# Patient Record
Sex: Female | Born: 1995 | Race: Black or African American | Hispanic: No | Marital: Single | Smoking: Never smoker
Health system: Southern US, Community
[De-identification: ages and names within clinical notes are randomized; demographics above are authoritative.]

## PROBLEM LIST (undated history)

## (undated) DIAGNOSIS — F319 Bipolar disorder, unspecified: Secondary | ICD-10-CM

---
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
## 2013-10-22 ENCOUNTER — Encounter (HOSPITAL_COMMUNITY): Payer: Self-pay | Admitting: Emergency Medicine

## 2013-10-22 ENCOUNTER — Emergency Department (HOSPITAL_COMMUNITY)
Admission: EM | Admit: 2013-10-22 | Discharge: 2013-10-22 | Disposition: A | Discharge status: Home / Self Care | Payer: Commercial Indemnity | Attending: Emergency Medicine | Admitting: Emergency Medicine

## 2013-10-22 DIAGNOSIS — Z792 Long term (current) use of antibiotics: Secondary | ICD-10-CM | POA: Insufficient documentation

## 2013-10-22 DIAGNOSIS — L03317 Cellulitis of buttock: Secondary | ICD-10-CM | POA: Diagnosis not present

## 2013-10-22 DIAGNOSIS — R509 Fever, unspecified: Secondary | ICD-10-CM | POA: Insufficient documentation

## 2013-10-22 DIAGNOSIS — L0231 Cutaneous abscess of buttock: Secondary | ICD-10-CM | POA: Diagnosis present

## 2013-10-22 MED ORDER — TRAMADOL HCL 50 MG PO TABS: 50.0000 mg | ORAL_TABLET | Freq: Four times a day (QID) | ORAL | Status: DC | PRN

## 2013-10-22 MED ORDER — SULFAMETHOXAZOLE-TMP DS 800-160 MG PO TABS: 1.0000 | ORAL_TABLET | Freq: Two times a day (BID) | ORAL | Status: DC

## 2013-10-22 MED ORDER — MORPHINE SULFATE 4 MG/ML IJ SOLN
4.0000 mg | Freq: Once | INTRAMUSCULAR | Status: AC
  Administered 2013-10-22: 4 mg via INTRAMUSCULAR
  Filled 2013-10-22: qty 1

## 2013-10-22 NOTE — ED Notes (Signed)
Pt reports having painful abscess to her buttock x 3 days. Denies fever. No acute distress noted at triage.

## 2013-10-22 NOTE — ED Provider Notes (Signed)
CSN: [id number]     Arrival date & time 10/22/13  1139 History   First MD Initiated Contact with Patient 10/22/13 1303     Chief Complaint  Patient presents with  . Abscess     (Consider location/radiation/quality/duration/timing/severity/associated sxs/prior Treatment) HPI Comments: Patient is a 18 yo F presenting to the emergency department for an abscess to her right buttock that began 3 days ago. She states it was initially small but has increased in size. It is severely painful. She states it is painful to sit on her bottom or to palpate the area. She has tried taking old antibiotics from previous abscess on with an old oxycodone with little to no improvement of symptoms. Patient states he's had an abscess to the same area in the past that was successfully I&D'd in the emergency department. Does endorse one episode of fever 102F (PO) last evening none since.   Patient is a 18 y.o. female presenting with abscess.  Abscess Associated symptoms: fever     History reviewed. No pertinent past medical history. History reviewed. No pertinent past surgical history. Family History  Problem Relation Age of Onset  . Diabetes Mother    History  Substance Use Topics  . Smoking status: Never Smoker   . Smokeless tobacco: Not on file  . Alcohol Use: No   OB History   Grav Para Term Preterm Abortions TAB SAB Ect Mult Living                 Review of Systems  Constitutional: Positive for fever.  Skin: Positive for wound.  All other systems reviewed and are negative.     Allergies  Review of patient's allergies indicates no known allergies.  Home Medications   Prior to Admission medications   Medication Sig Start Date End Date Taking? Authorizing Provider  sulfamethoxazole-trimethoprim (BACTRIM DS) 800-160 MG per tablet Take 1 tablet by mouth 2 (two) times daily. 10/22/13   Keats Kingry L Vanice Rappa, PA-C  traMADol (ULTRAM) 50 MG tablet Take 1 tablet (50 mg total) by mouth every 6  (six) hours as needed. 10/22/13   Tareka Jhaveri L Antoneo Ghrist, PA-C   BP 118/71  Pulse 101  Temp(Src) 98.2 F (36.8 C) (Oral)  Resp 16  Ht 5\' 8"  (1.727 m)  Wt 192 lb 9 oz (87.346 kg)  BMI 29.29 kg/m2  SpO2 100% Physical Exam  Nursing note and vitals reviewed. Constitutional: She is oriented to person, place, and time. She appears well-developed and well-nourished. No distress.  HENT:  Head: Normocephalic and atraumatic.  Right Ear: External ear normal.  Left Ear: External ear normal.  Nose: Nose normal.  Mouth/Throat: Oropharynx is clear and moist.  Eyes: Conjunctivae are normal.  Neck: Normal range of motion. Neck supple.  Cardiovascular: Normal rate, regular rhythm and normal heart sounds.   Pulmonary/Chest: Effort normal and breath sounds normal. No respiratory distress.  Abdominal: Soft. There is no tenderness.  Musculoskeletal: Normal range of motion.  Neurological: She is alert and oriented to person, place, and time.  Skin: Skin is warm and dry. She is not diaphoretic.     Psychiatric: She has a normal mood and affect.    ED Course  Procedures (including critical care time) Medications  morphine 4 MG/ML injection 4 mg (4 mg Intramuscular Given 10/22/13 1341)    Labs Review Labs Reviewed - No data to display  Imaging Review No results found.   EKG Interpretation None      INCISION AND DRAINAGE Performed by: Silvestre Moment,  Kiran Lapine L Consent: Verbal consent obtained. Risks and benefits: risks, benefits and alternatives were discussed Type: abscess  Body area: right buttock  Anesthesia: local infiltration  Incision was made with a scalpel.  Local anesthetic: lidocaine 2% w/ epinephrine  Anesthetic total: 7 ml  Complexity: complex Blunt dissection to break up loculations  Drainage: purulent  Drainage amount: minimal  Packing material: none  Patient tolerance: Patient tolerated the procedure well with no immediate complications.    MDM   Final  diagnoses:  Abscess of buttock, right    Filed Vitals:   10/22/13 1400  BP: 118/71  Pulse: 101  Temp:   Resp: 16   Afebrile, NAD, non-toxic appearing, AAOx4. Tachycardia improved with pain management.  Patient with skin abscess amenable to incision and drainage.  Abscess was not large enough to warrant packing or drain,  wound recheck in 2 days. Encouraged home warm soaks and flushing.  Mild signs of cellulitis is surrounding skin.  Will d/c to home with pain medications and antibiotics. Advised PCP recheck in 2 days. Return precautions discussed. Patient is agreeable to plan. Patient is stable at time of discharge       Jeannetta EllisJennifer L Hedda Crumbley, PA-C 10/22/13 1534

## 2013-10-22 NOTE — Discharge Instructions (Signed)
Please follow up with your primary care physician in 1-2 days. If you do not have one please call the [facility] number listed above. Please take your antibiotic until completion. Please take pain medication and/or muscle relaxants as prescribed and as needed for pain. Please do not drive on narcotic pain medication or on muscle relaxants. Please use warm compresses to help the area. Please read all discharge instructions and return precautions.    Abscess Care After An abscess (also called a boil or furuncle) is an infected area that contains a collection of pus. Signs and symptoms of an abscess include pain, tenderness, redness, or hardness, or you may feel a moveable soft area under your skin. An abscess can occur anywhere in the body. The infection may spread to surrounding tissues causing cellulitis. A cut (incision) by the surgeon was made over your abscess and the pus was drained out. Gauze may have been packed into the space to provide a drain that will allow the cavity to heal from the inside outwards. The boil may be painful for 5 to 7 days. Most people with a boil do not have high fevers. Your abscess, if seen early, may not have localized, and may not have been lanced. If not, another appointment may be required for this if it does not get better on its own or with medications. HOME CARE INSTRUCTIONS   Only take over-the-counter or prescription medicines for pain, discomfort, or fever as directed by your caregiver.  When you bathe, soak and then remove gauze or iodoform packs at least daily or as directed by your caregiver. You may then wash the wound gently with mild soapy water. Repack with gauze or do as your caregiver directs. SEEK IMMEDIATE MEDICAL CARE IF:   You develop increased pain, swelling, redness, drainage, or bleeding in the wound site.  You develop signs of generalized infection including muscle aches, chills, fever, or a general ill feeling.  An oral  temperature above 102 F (38.9 C) develops, not controlled by medication. See your caregiver for a recheck if you develop any of the symptoms described above. If medications (antibiotics) were prescribed, take them as directed. Document Released: 09/11/2004 Document Revised: 05/18/2011 Document Reviewed: 05/09/2007 [facility] [employer] Patient Information 2015 [employer] [place]. This information is not intended to replace advice given to you by your health care provider. Make sure you discuss any questions you have with your health care provider.

## 2013-10-22 NOTE — ED Notes (Signed)
PA at bedside treating abcess

## 2013-10-25 NOTE — ED Provider Notes (Signed)
Medical screening examination/treatment/procedure(s) were performed by non-physician practitioner and as supervising physician I was immediately available for consultation/collaboration.  Bob Eastwood T Obaloluwa Delatte, MD 10/25/13 1001 

## 2013-10-26 ENCOUNTER — Ambulatory Visit (INDEPENDENT_AMBULATORY_CARE_PROVIDER_SITE_OTHER): Payer: Managed Care, Other (non HMO) | Admitting: Family Medicine

## 2013-10-26 ENCOUNTER — Encounter: Payer: Self-pay | Admitting: Family Medicine

## 2013-10-26 VITALS — BP 110/72 | HR 86 | Temp 98.8°F | Resp 14 | Wt 189.0 lb

## 2013-10-26 DIAGNOSIS — L03317 Cellulitis of buttock: Principal | ICD-10-CM

## 2013-10-26 DIAGNOSIS — L0231 Cutaneous abscess of buttock: Secondary | ICD-10-CM

## 2013-10-26 MED ORDER — CLINDAMYCIN HCL 300 MG PO CAPS: 300.0000 mg | ORAL_CAPSULE | Freq: Three times a day (TID) | ORAL | Status: DC

## 2013-10-26 NOTE — Progress Notes (Signed)
Subjective:    Patient ID: Leslie Ballard, female    DOB: Dec 09, 1995, 18 y.o.   MRN: [id number]  HPI  Patient was seen in the emergency room 4 days ago for absccess on her left buttock. In the emergency room she underwent incision and drainage. Very little purulent material was expressed. There is no wound culture for followup. The patient was started on Bactrim to cover possible MRSA. She is here today for followup. Unfortunately she states that the pain is just as bad as it was 4 days ago. States the pain may actually be worse. Examination of the area shows a vertical incision approximately 3 cm long on the left intergluteal cleft. There is slight erythema. The area above the incision is tense and firm and not fluctuant. It is extremely tender when I press in this area. No past medical history on file. Current Outpatient Prescriptions on File Prior to Visit  Medication Sig Dispense Refill  . sulfamethoxazole-trimethoprim (BACTRIM DS) 800-160 MG per tablet Take 1 tablet by mouth 2 (two) times daily.  14 tablet  0  . traMADol (ULTRAM) 50 MG tablet Take 1 tablet (50 mg total) by mouth every 6 (six) hours as needed.  15 tablet  0   No current facility-administered medications on file prior to visit.   No Known Allergies History   Social History  . Marital Status: Single    Spouse Name: N/A    Number of Children: N/A  . Years of Education: N/A   Occupational History  . Not on file.   Social History Main Topics  . Smoking status: Never Smoker   . Smokeless tobacco: Not on file  . Alcohol Use: No  . Drug Use: No  . Sexual Activity: Yes    Birth Control/ Protection: Injection   Other Topics Concern  . Not on file   Social History Narrative  . No narrative on file     Review of Systems  All other systems reviewed and are negative.      Objective:   Physical Exam  Vitals reviewed. Cardiovascular: Normal rate and regular rhythm.   Pulmonary/Chest: Effort normal and  breath sounds normal.  Skin: Skin is warm. There is erythema.   Examination of the area shows a vertical incision approximately 3 cm long on the left intergluteal cleft. There is slight erythema. The area above the incision is tense and firm and not fluctuant. It is extremely tender when I press in this area. The tender firm area is approximately 5 cm in diameter.       Assessment & Plan:  Cellulitis and abscess of buttock  Patient seems to be worsening. I anesthetized the tender area with 0.1% lidocaine. I extended the patient's incision. I probed the wound with hemostats to break up any loculations. Copious purulent material was expressed. There was at least 50 mL of pus and blood expressed from the wound cavity.  The wound cavity was approximately 2 inches deep.   A wound culture was sent of the wound cavity. I then packed the wound with over 12 inches of quarter inch iodoform gauze. Recheck the patient in 24 hours.  She is currently covered for gram negatives as well as MRSA with Bactrim.  I will add clindamycin to cover anaerobes and extend MRSA coverage 300 mg potid for 7 days.  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  If this is worsening she will need general surgical evaluation for possible deep incision and  drainage.

## 2013-10-27 ENCOUNTER — Ambulatory Visit (INDEPENDENT_AMBULATORY_CARE_PROVIDER_SITE_OTHER): Payer: Managed Care, Other (non HMO) | Admitting: Family Medicine

## 2013-10-27 ENCOUNTER — Ambulatory Visit: Payer: Managed Care, Other (non HMO) | Admitting: Family Medicine

## 2013-10-27 ENCOUNTER — Encounter: Payer: Self-pay | Admitting: Family Medicine

## 2013-10-27 VITALS — BP 146/94 | HR 78 | Temp 98.5°F | Resp 16

## 2013-10-27 DIAGNOSIS — L0231 Cutaneous abscess of buttock: Secondary | ICD-10-CM

## 2013-10-27 DIAGNOSIS — L03317 Cellulitis of buttock: Principal | ICD-10-CM

## 2013-10-27 NOTE — Progress Notes (Signed)
Subjective:    Patient ID: Leslie Ballard, female    DOB: [birth year], 18 y.o.   MRN: [id number]  HPI  10/26/13 Patient was seen in the emergency room 4 days ago for absccess on her left buttock. In the emergency room she underwent incision and drainage. Very little purulent material was expressed. There is no wound culture for followup. The patient was started on Bactrim to cover possible MRSA. She is here today for followup. Unfortunately she states that the pain is just as bad as it was 4 days ago. States the pain may actually be worse. Examination of the area shows a vertical incision approximately 3 cm long on the left intergluteal cleft. There is slight erythema. The area above the incision is tense and firm and not fluctuant. It is extremely tender when I press in this area.  At that time, my plan was:  Patient seems to be worsening. I anesthetized the tender area with 0.1% lidocaine. I extended the patient's incision. I probed the wound with hemostats to break up any loculations. Copious purulent material was expressed. There was at least 50 mL of pus and blood expressed from the wound cavity.  The wound cavity was approximately 2 inches deep.   A wound culture was sent of the wound cavity. I then packed the wound with over 12 inches of quarter inch iodoform gauze. Recheck the patient in 24 hours.  She is currently covered for gram negatives as well as MRSA with Bactrim.  I will add clindamycin to cover anaerobes and extend MRSA coverage 300 mg potid for 7 days.  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  If this is worsening she will need general surgical evaluation for possible deep incision and drainage.  10/27/13 Patient is here today for recheck. No past medical history on file. Current Outpatient Prescriptions on File Prior to Visit  Medication Sig Dispense Refill  . clindamycin (CLEOCIN) 300 MG capsule Take 1 capsule (300 mg total) by mouth 3 (three) times daily.  21  capsule  0  . sulfamethoxazole-trimethoprim (BACTRIM DS) 800-160 MG per tablet Take 1 tablet by mouth 2 (two) times daily.  14 tablet  0  . traMADol (ULTRAM) 50 MG tablet Take 1 tablet (50 mg total) by mouth every 6 (six) hours as needed.  15 tablet  0   No current facility-administered medications on file prior to visit.   No Known Allergies History   Social History  . Marital Status: Single    Spouse Name: N/A    Number of Children: N/A  . Years of Education: N/A   Occupational History  . Not on file.   Social History Main Topics  . Smoking status: Never Smoker   . Smokeless tobacco: Not on file  . Alcohol Use: No  . Drug Use: No  . Sexual Activity: Yes    Birth Control/ Protection: Injection   Other Topics Concern  . Not on file   Social History Narrative  . No narrative on file     Review of Systems  All other systems reviewed and are negative.      Objective:   Physical Exam  Vitals reviewed. Cardiovascular: Normal rate and regular rhythm.   Pulmonary/Chest: Effort normal and breath sounds normal.  Skin: Skin is warm. There is erythema.   there is less erythema today. There is no longer indurated or fluctuant. It is somewhat tender to palpation but it is dramatically improved from yesterday.  Assessment & Plan:  Cellulitis and abscess of buttock  Abscess definitely seems to be improved from yesterday. I removed all 12 inches of packing I placed him yesterday. I replaced it with fresh iodoform gauze. The wound is packed with 12 inches of 1/4 inch iodoform gauze.  Wound care was discussed. The patient's family was instructed to remove approximately 1 inch gauze every day until all the packing is removed. She is instructed to continue her antibiotics until completed. Followup in one-week if no better or immediately if worse. I anticipate gradual resolution of the wound over the next 2-3 weeks.

## 2013-10-29 LAB — WOUND CULTURE: Gram Stain: NONE SEEN

## 2013-11-03 ENCOUNTER — Ambulatory Visit (INDEPENDENT_AMBULATORY_CARE_PROVIDER_SITE_OTHER): Payer: Managed Care, Other (non HMO) | Admitting: Family Medicine

## 2013-11-03 ENCOUNTER — Encounter: Payer: Self-pay | Admitting: Family Medicine

## 2013-11-03 VITALS — BP 128/72 | HR 74 | Temp 98.5°F | Resp 18 | Wt 192.0 lb

## 2013-11-03 DIAGNOSIS — L03317 Cellulitis of buttock: Principal | ICD-10-CM

## 2013-11-03 DIAGNOSIS — L0231 Cutaneous abscess of buttock: Secondary | ICD-10-CM

## 2013-11-03 NOTE — Progress Notes (Signed)
Subjective:    Patient ID: Leslie Ballard, female    DOB: 13-Oct-1995, 18 y.o.   MRN: [id number]  HPI  10/26/13 Patient was seen in the emergency room 4 days ago for absccess on her left buttock. In the emergency room she underwent incision and drainage. Very little purulent material was expressed. There is no wound culture for followup. The patient was started on Bactrim to cover possible MRSA. She is here today for followup. Unfortunately she states that the pain is just as bad as it was 4 days ago. States the pain may actually be worse. Examination of the area shows a vertical incision approximately 3 cm long on the left intergluteal cleft. There is slight erythema. The area above the incision is tense and firm and not fluctuant. It is extremely tender when I press in this area.  At that time, my plan was:  Patient seems to be worsening. I anesthetized the tender area with 0.1% lidocaine. I extended the patient's incision. I probed the wound with hemostats to break up any loculations. Copious purulent material was expressed. There was at least 50 mL of pus and blood expressed from the wound cavity.  The wound cavity was approximately 2 inches deep.   A wound culture was sent of the wound cavity. I then packed the wound with over 12 inches of quarter inch iodoform gauze. Recheck the patient in 24 hours.  She is currently covered for gram negatives as well as MRSA with Bactrim.  I will add clindamycin to cover anaerobes and extend MRSA coverage 300 mg potid for 7 days.  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  If this is worsening she will need general surgical evaluation for possible deep incision and drainage.  10/27/13 Patient is here today for recheck.  At that time, my plan was: Cellulitis and abscess of buttock  Abscess definitely seems to be improved from yesterday. I removed all 12 inches of packing I placed him yesterday. I replaced it with fresh iodoform gauze. The wound  is packed with 12 inches of 1/4 inch iodoform gauze.  Wound care was discussed. The patient's family was instructed to remove approximately 1 inch gauze every day until all the packing is removed. She is instructed to continue her antibiotics until completed. Followup in one-week if no better or immediately if worse. I anticipate gradual resolution of the wound over the next 2-3 weeks.  11/03/13 Patient is here today for recheck. The wound looks much better. There is still a 3 cm very shallow incision. However the wound cavity has completely closed. All the gauze has come out.  There is no induration. There is no erythema. There is no pain or warmth to palpation. No past medical history on file. Current Outpatient Prescriptions on File Prior to Visit  Medication Sig Dispense Refill  . clindamycin (CLEOCIN) 300 MG capsule Take 1 capsule (300 mg total) by mouth 3 (three) times daily.  21 capsule  0  . sulfamethoxazole-trimethoprim (BACTRIM DS) 800-160 MG per tablet Take 1 tablet by mouth 2 (two) times daily.  14 tablet  0  . traMADol (ULTRAM) 50 MG tablet Take 1 tablet (50 mg total) by mouth every 6 (six) hours as needed.  15 tablet  0   No current facility-administered medications on file prior to visit.   No Known Allergies History   Social History  . Marital Status: Single    Spouse Name: N/A    Number of Children: N/A  .  Years of Education: N/A   Occupational History  . Not on file.   Social History Main Topics  . Smoking status: Never Smoker   . Smokeless tobacco: Not on file  . Alcohol Use: No  . Drug Use: No  . Sexual Activity: Yes    Birth Control/ Protection: Injection   Other Topics Concern  . Not on file   Social History Narrative  . No narrative on file     Review of Systems  All other systems reviewed and are negative.      Objective:   Physical Exam  Vitals reviewed. Cardiovascular: Normal rate and regular rhythm.   Pulmonary/Chest: Effort normal and  breath sounds normal.  Skin: Skin is warm. No erythema.        Assessment & Plan:  Cellulitis and abscess of buttock   Situation has almost completely resolved. I recommend patient keep the residual wound covered with Polysporin every time she goes to the restroom. I recommend she keep clean and dry. I anticipate gradual closure of the wound by secondary intention over the next 7-10 days. Recheck in 2 weeks if persistent or immediately if worse. Otherwise no followup is necessary.

## 2013-11-21 ENCOUNTER — Ambulatory Visit: Payer: Managed Care, Other (non HMO)

## 2014-01-17 ENCOUNTER — Encounter: Payer: Self-pay | Admitting: Family Medicine

## 2014-01-17 ENCOUNTER — Ambulatory Visit (INDEPENDENT_AMBULATORY_CARE_PROVIDER_SITE_OTHER): Payer: Medicaid Other | Admitting: Family Medicine

## 2014-01-17 VITALS — BP 128/72 | HR 62 | Temp 98.6°F | Resp 16 | Ht 65.0 in | Wt 194.0 lb

## 2014-01-17 DIAGNOSIS — L0231 Cutaneous abscess of buttock: Secondary | ICD-10-CM

## 2014-01-17 MED ORDER — SULFAMETHOXAZOLE-TRIMETHOPRIM 800-160 MG PO TABS: 1.0000 | ORAL_TABLET | Freq: Two times a day (BID) | ORAL | Status: DC

## 2014-01-17 MED ORDER — HYDROCODONE-ACETAMINOPHEN 5-325 MG PO TABS: 1.0000 | ORAL_TABLET | Freq: Four times a day (QID) | ORAL | Status: DC | PRN

## 2014-01-17 NOTE — Progress Notes (Signed)
Patient ID: Leslie DeistJessica Ann Vallandingham, female   DOB: 08/24/1995, 18 y.o.   MRN: [id number]   Subjective:    Patient ID: Leslie DeistJessica Ann Bonelli, female    DOB: 08/24/1995, 18 y.o.   MRN: [id number]  Patient presents for Cyst patient here with recurrent abscess to right gluteus, she was seen in the emergency room back in August she had a small I and D done at that time however her abscess did not improve and worsened she didn't presented to our office as her primary care provider and a second incision and drainage was done which did produce a large amount of pus. The abscess did heal up back in the same exact spot as the previous. She states that he came up over the past 3 days out of nowhere. She is in significant pain, denies fever    Review Of Systems:  GEN- denies fatigue, fever, weight loss,weakness, recent illness HEENT- denies eye drainage, change in vision, nasal discharge, CVS- denies chest pain, palpitations RESP- denies SOB, cough, wheeze ABD- denies N/V, change in stools, abd pain GU- denies dysuria, hematuria, dribbling, incontinence MSK- denies joint pain, muscle aches, injury Neuro- denies headache, dizziness, syncope, seizure activity       Objective:    BP 128/72 mmHg  Pulse 62  Temp(Src) 98.6 F (37 C) (Oral)  Resp 16  Ht 5\' 5"  (1.651 m)  Wt 194 lb (87.998 kg)  BMI 32.28 kg/m2 GEN- Uncomfortable appearing, cyring, NAD , alert and oriented x 3 Skin- RIght gluteal cleft extending to gluteus - large 3 x 4cm fluctant region with erythem very TTP,   Procedure- Incision and Drainage Procedure explained to patient questions answered benefits and risks discussed verbal consent obtained. Antiseptic-Betadine Anesthesia-lidocaine w epi Incision performed large amount of pus expressed ( Approx 3/4 cup Culture taken 1 foot of iodorm packed into large cavity Patient tolerated procedure well Bandage applied       Assessment & Plan:      Problem List Items Addressed This Visit     None    Visit Diagnoses    Abscess, gluteal, right    -  Primary    recurrent gluteal abscess, needs surgical evaluation, large amount of pus removed, deep cavity, start bactrim, norco    Relevant Orders       Ambulatory referral to General Surgery       Wound culture       Note: This dictation was prepared with Dragon dictation along with smaller phrase technology. Any transcriptional errors that result from this process are unintentional.

## 2014-01-17 NOTE — Patient Instructions (Signed)
Go to surgeons appointment as scheduled Take antibiotics by mouth twice a day  Take pain medication Sitz bath

## 2014-01-19 ENCOUNTER — Ambulatory Visit: Payer: Managed Care, Other (non HMO) | Admitting: Family Medicine

## 2014-01-20 LAB — WOUND CULTURE: GRAM STAIN: NONE SEEN

## 2015-05-07 ENCOUNTER — Encounter: Payer: Self-pay | Admitting: Family Medicine

## 2015-05-17 LAB — POCT URINE PREGNANCY: Preg Test, Ur: NEGATIVE

## 2015-08-27 ENCOUNTER — Encounter: Payer: Self-pay | Admitting: Family Medicine

## 2016-04-07 LAB — POCT URINE PREGNANCY: Preg Test, Ur: NEGATIVE

## 2018-10-17 ENCOUNTER — Other Ambulatory Visit: Payer: Self-pay

## 2018-10-17 DIAGNOSIS — R6889 Other general symptoms and signs: Secondary | ICD-10-CM | POA: Diagnosis not present

## 2018-10-17 DIAGNOSIS — Z20822 Contact with and (suspected) exposure to covid-19: Secondary | ICD-10-CM

## 2018-10-19 LAB — NOVEL CORONAVIRUS, NAA: SARS-CoV-2, NAA: NOT DETECTED

## 2018-10-26 ENCOUNTER — Encounter (HOSPITAL_COMMUNITY): Payer: Self-pay

## 2018-10-26 ENCOUNTER — Emergency Department (HOSPITAL_COMMUNITY)
Admission: EM | Admit: 2018-10-26 | Discharge: 2018-10-26 | Disposition: A | Discharge status: Home / Self Care | Payer: Commercial Indemnity | Attending: Emergency Medicine | Admitting: Emergency Medicine

## 2018-10-26 ENCOUNTER — Other Ambulatory Visit: Payer: Self-pay

## 2018-10-26 ENCOUNTER — Emergency Department (HOSPITAL_COMMUNITY): Payer: Commercial Indemnity

## 2018-10-26 DIAGNOSIS — Y999 Unspecified external cause status: Secondary | ICD-10-CM | POA: Insufficient documentation

## 2018-10-26 DIAGNOSIS — Y93I9 Activity, other involving external motion: Secondary | ICD-10-CM | POA: Insufficient documentation

## 2018-10-26 DIAGNOSIS — Z79899 Other long term (current) drug therapy: Secondary | ICD-10-CM | POA: Diagnosis not present

## 2018-10-26 DIAGNOSIS — M25519 Pain in unspecified shoulder: Secondary | ICD-10-CM | POA: Diagnosis not present

## 2018-10-26 DIAGNOSIS — M25512 Pain in left shoulder: Secondary | ICD-10-CM | POA: Diagnosis not present

## 2018-10-26 DIAGNOSIS — M545 Low back pain: Secondary | ICD-10-CM | POA: Diagnosis not present

## 2018-10-26 DIAGNOSIS — Y92410 Unspecified street and highway as the place of occurrence of the external cause: Secondary | ICD-10-CM | POA: Diagnosis not present

## 2018-10-26 DIAGNOSIS — R52 Pain, unspecified: Secondary | ICD-10-CM | POA: Diagnosis not present

## 2018-10-26 HISTORY — DX: Bipolar disorder, unspecified: F31.9

## 2018-10-26 MED ORDER — METHOCARBAMOL 500 MG PO TABS: 500.0000 mg | ORAL_TABLET | Freq: Three times a day (TID) | ORAL | 0 refills | Status: DC | PRN

## 2018-10-26 MED ORDER — NAPROXEN 500 MG PO TABS: 500.0000 mg | ORAL_TABLET | Freq: Two times a day (BID) | ORAL | 0 refills | Status: DC

## 2018-10-26 MED ORDER — ACETAMINOPHEN 325 MG PO TABS
650.0000 mg | ORAL_TABLET | Freq: Once | ORAL | Status: AC
  Administered 2018-10-26: 19:00:00 650 mg via ORAL
  Filled 2018-10-26: qty 2
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
## 2018-12-06 DIAGNOSIS — F919 Conduct disorder, unspecified: Secondary | ICD-10-CM | POA: Diagnosis not present

## 2018-12-06 DIAGNOSIS — F331 Major depressive disorder, recurrent, moderate: Secondary | ICD-10-CM | POA: Diagnosis not present

## 2018-12-06 DIAGNOSIS — F79 Unspecified intellectual disabilities: Secondary | ICD-10-CM | POA: Diagnosis not present

## 2018-12-06 DIAGNOSIS — F419 Anxiety disorder, unspecified: Secondary | ICD-10-CM | POA: Diagnosis not present

## 2018-12-06 DIAGNOSIS — F632 Kleptomania: Secondary | ICD-10-CM | POA: Diagnosis not present

## 2018-12-07 ENCOUNTER — Ambulatory Visit (INDEPENDENT_AMBULATORY_CARE_PROVIDER_SITE_OTHER): Payer: Medicaid Other

## 2018-12-07 ENCOUNTER — Other Ambulatory Visit: Payer: Self-pay

## 2018-12-07 DIAGNOSIS — Z23 Encounter for immunization: Secondary | ICD-10-CM

## 2018-12-13 ENCOUNTER — Ambulatory Visit: Payer: Medicaid Other | Admitting: Podiatry

## 2018-12-15 ENCOUNTER — Ambulatory Visit (INDEPENDENT_AMBULATORY_CARE_PROVIDER_SITE_OTHER): Payer: Medicaid Other

## 2018-12-15 ENCOUNTER — Ambulatory Visit: Payer: Medicaid Other | Admitting: Podiatry

## 2018-12-15 ENCOUNTER — Encounter: Payer: Self-pay | Admitting: Podiatry

## 2018-12-15 ENCOUNTER — Other Ambulatory Visit: Payer: Self-pay

## 2018-12-15 VITALS — BP 142/94 | HR 84 | Resp 16

## 2018-12-15 DIAGNOSIS — M722 Plantar fascial fibromatosis: Secondary | ICD-10-CM

## 2018-12-15 DIAGNOSIS — N920 Excessive and frequent menstruation with regular cycle: Secondary | ICD-10-CM | POA: Insufficient documentation

## 2018-12-15 MED ORDER — DICLOFENAC SODIUM 75 MG PO TBEC: 75.0000 mg | DELAYED_RELEASE_TABLET | Freq: Two times a day (BID) | ORAL | 2 refills | Status: DC

## 2018-12-15 NOTE — Progress Notes (Signed)
   Subjective:    Patient ID: Leslie Ballard, female    DOB: [birth year], 23 y.o.   MRN: [id number]  HPI    Review of Systems  All other systems reviewed and are negative.      Objective:   Physical Exam        Assessment & Plan:

## 2018-12-15 NOTE — Patient Instructions (Signed)

## 2018-12-15 NOTE — Progress Notes (Signed)
Subjective:   Patient ID: Leslie Ballard, female   DOB: 23 y.o.   MRN: [id number]   HPI This report is in the physical exam beginning report   Review of Systems  All other systems reviewed and are negative.       Objective:  Physical Exam Vitals signs and nursing note reviewed.  Constitutional:      Appearance: She is well-developed.  Pulmonary:     Effort: Pulmonary effort is normal.  Musculoskeletal: Normal range of motion.  Skin:    General: Skin is warm.  Neurological:     Mental Status: She is alert.     Patient had a lot of pain in her left heel for the last couple months and she does not remember specific injury but she is working on P[employer] consultant currently.  Patient does not smoke moderately obese and likes to be active.  I noted there to be exquisite discomfort plantar fascial left at the insertional point tendon calcaneus with good neurovascular status noted bilateral     Assessment:  Acute plantar fasciitis left with inflammation fluid buildup     Plan:  HEP condition reviewed sterile prep done injected the fascia left 3 mg Kenalog 5 mg Xylocaine advised on supportive shoes stretching exercises heel lift and reappoint to recheck as needed  X-rays indicate that there is small spur no indication stress fracture arthritis

## 2019-01-13 ENCOUNTER — Ambulatory Visit (INDEPENDENT_AMBULATORY_CARE_PROVIDER_SITE_OTHER): Payer: Medicaid Other | Admitting: Family Medicine

## 2019-01-13 ENCOUNTER — Other Ambulatory Visit: Payer: Self-pay

## 2019-01-13 ENCOUNTER — Encounter: Payer: Self-pay | Admitting: Family Medicine

## 2019-01-13 VITALS — BP 140/90 | HR 100 | Temp 97.6°F | Resp 14 | Ht 65.0 in | Wt 264.0 lb

## 2019-01-13 DIAGNOSIS — Z0000 Encounter for general adult medical examination without abnormal findings: Secondary | ICD-10-CM | POA: Diagnosis not present

## 2019-01-13 MED ORDER — NORETHINDRONE ACET-ETHINYL EST 1.5-30 MG-MCG PO TABS: 1.0000 | ORAL_TABLET | Freq: Every day | ORAL | 11 refills | Status: DC

## 2019-01-13 NOTE — Progress Notes (Signed)
Subjective:    Patient ID: Leslie Ballard, female    DOB: 1995/06/05, 23 y.o.   MRN: [id number]  HPI Patient is a very pleasant 23 year old African-American female here today with her mother for physical exam.  She has a history of bipolar 1 disorder as well as some mild intellectual delay.  She is currently disabled and lives at home.  She is not working.  She denies any medical concerns.  She is currently being seen by a psychiatrist for bipolar 1 disorder.  She is on a combination of Prozac, Risperdal, and Tegretol to help manage her mood swings.  She has not had any fasting lab work in quite some time.  She also has a very strong family history of type 2 diabetes mellitus and the patient has obesity.  Therefore I am concerned about type 2 diabetes mellitus in her.  I would recommend fasting lab work to monitor this.  She recently had a flu shot at this clinic and this is up-to-date.  She is due for a tetanus shot but she declines this today.  She just [facility] which was normal.  Therefore she is not due for a repeat Pap smear for 2 more years.  She would like to resume her birth control which I believe is completely appropriate particular given the fact she is on seizure medication for bipolar 1 disorder.  She has taken this in the past also for menorrhagia.  Previously she was using the Depo shot.  However due to weight gain I would try to avoid that.  We discussed options and she would like to try birth control pills. Past Medical History:  Diagnosis Date  . Bipolar 1 disorder (HCC)    Current Outpatient Medications on File Prior to Visit  Medication Sig Dispense Refill  . carBAMazepine (TEGRETOL PO) Take 500 mg by mouth daily.    Marland Kitchen FLUoxetine (PROZAC) 20 MG tablet Take 20 mg by mouth daily. Take one pill in the morning    . risperiDONE (RISPERDAL PO) Take 1 tablet by mouth 2 (two) times daily.     No current facility-administered medications on  file prior to visit.    No past surgical history on file. No Known Allergies Social History   Socioeconomic History  . Marital status: Single    Spouse name: Not on file  . Number of children: Not on file  . Years of education: Not on file  . Highest education level: Not on file  Occupational History  . Not on file  Social Needs  . Financial resource strain: Not on file  . Food insecurity    Worry: Not on file    Inability: Not on file  . Transportation needs    Medical: Not on file    Non-medical: Not on file  Tobacco Use  . Smoking status: Never Smoker  . Smokeless tobacco: Never Used  Substance and Sexual Activity  . Alcohol use: No  . Drug use: No  . Sexual activity: Yes    Birth control/protection: Injection  Lifestyle  . Physical activity    Days per week: Not on file    Minutes per session: Not on file  . Stress: Not on file  Relationships  . Social Musician on phone: Not on file    Gets together: Not on file    Attends religious service: Not on file    Active member of club  or organization: Not on file    Attends meetings of clubs or organizations: Not on file    Relationship status: Not on file  . Intimate partner violence    Fear of current or ex partner: Not on file    Emotionally abused: Not on file    Physically abused: Not on file    Forced sexual activity: Not on file  Other Topics Concern  . Not on file  Social History Narrative  . Not on file   Family History  Problem Relation Age of Onset  . Diabetes Mother   . Hypertension Mother   . Hypertension Father   . Diabetes Father   . Diabetes Paternal Uncle   . Gout Paternal Uncle      Review of Systems     Objective:   Physical Exam Vitals signs reviewed.  Constitutional:      General: She is not in acute distress.    Appearance: Normal appearance. She is obese. She is not ill-appearing, toxic-appearing or diaphoretic.  HENT:     Head: Normocephalic and atraumatic.      Right Ear: Tympanic membrane, ear canal and external ear normal. There is no impacted cerumen.     Left Ear: Tympanic membrane, ear canal and external ear normal. There is no impacted cerumen.     Nose: Nose normal. No congestion or rhinorrhea.     Mouth/Throat:     Mouth: Mucous membranes are moist.     Pharynx: Oropharynx is clear. No oropharyngeal exudate or posterior oropharyngeal erythema.  Eyes:     General: No scleral icterus.       Right eye: No discharge.        Left eye: No discharge.     Extraocular Movements: Extraocular movements intact.     Conjunctiva/sclera: Conjunctivae normal.     Pupils: Pupils are equal, round, and reactive to light.  Neck:     Musculoskeletal: Normal range of motion and neck supple. No neck rigidity or muscular tenderness.     Vascular: No carotid bruit.  Cardiovascular:     Rate and Rhythm: Normal rate and regular rhythm.     Pulses: Normal pulses.     Heart sounds: Normal heart sounds. No murmur. No friction rub. No gallop.   Pulmonary:     Effort: Pulmonary effort is normal. No respiratory distress.     Breath sounds: Normal breath sounds. No stridor. No wheezing, rhonchi or rales.  Chest:     Chest wall: No tenderness.  Abdominal:     General: Abdomen is flat. Bowel sounds are normal. There is no distension.     Palpations: Abdomen is soft. There is no mass.     Tenderness: There is no abdominal tenderness. There is no right CVA tenderness, left CVA tenderness, guarding or rebound.     Hernia: No hernia is present.  Musculoskeletal: Normal range of motion.     Right lower leg: No edema.     Left lower leg: No edema.  Lymphadenopathy:     Cervical: No cervical adenopathy.  Skin:    General: Skin is warm.     Coloration: Skin is not jaundiced or pale.     Findings: No bruising, erythema, lesion or rash.  Neurological:     General: No focal deficit present.     Mental Status: She is alert and oriented to person, place, and time. Mental  status is at baseline.     Cranial Nerves: No cranial nerve deficit.  Sensory: No sensory deficit.     Motor: No weakness.     Coordination: Coordination normal.     Gait: Gait normal.     Deep Tendon Reflexes: Reflexes normal.  Psychiatric:        Mood and Affect: Mood normal.        Behavior: Behavior normal.        Thought Content: Thought content normal.        Judgment: Judgment normal.           Assessment & Plan:  General medical exam - Plan: CBC with Differential, COMPLETE METABOLIC PANEL WITH GFR, Lipid Panel, CANCELED: PAP, Thin Prep w/HPV rflx HPV Type 16/18  Physical exam today is significant for obesity as well as hypertension.  I would like the patient to return fasting for CBC, CMP, fasting lipid panel.  If her blood sugar is elevated I will also check a hemoglobin A1c.  I recommended the patient take Loestrin 1.5/30, 1 pill p.o. daily for birth control.  I recommended that she start this the Sunday after her next menstrual cycle.  I have also recommended diet exercise and weight loss.  Patient's flu shot is up-to-date.  I offered the patient tetanus shot today but she politely declined.  Pap smear is next due in 2 years

## 2019-01-16 ENCOUNTER — Other Ambulatory Visit: Payer: Medicaid Other

## 2019-01-16 ENCOUNTER — Other Ambulatory Visit: Payer: Self-pay

## 2019-01-16 DIAGNOSIS — Z0000 Encounter for general adult medical examination without abnormal findings: Secondary | ICD-10-CM | POA: Diagnosis not present

## 2019-01-17 LAB — COMPLETE METABOLIC PANEL WITH GFR
AG Ratio: 1.2 (calc) (ref 1.0–2.5)
ALT: 12 U/L (ref 6–29)
AST: 16 U/L (ref 10–30)
Albumin: 3.8 g/dL (ref 3.6–5.1)
Alkaline phosphatase (APISO): 90 U/L (ref 31–125)
BUN: 10 mg/dL (ref 7–25)
CO2: 24 mmol/L (ref 20–32)
Calcium: 9.2 mg/dL (ref 8.6–10.2)
Chloride: 104 mmol/L (ref 98–110)
Creat: 0.71 mg/dL (ref 0.50–1.10)
GFR, Est African American: 137 mL/min/{1.73_m2} (ref >=60)
GFR, Est Non African American: 118 mL/min/{1.73_m2} (ref >=60)
Globulin: 3.1 g/dL (calc) (ref 1.9–3.7)
Glucose, Bld: 82 mg/dL (ref 65–99)
Potassium: 4.2 mmol/L (ref 3.5–5.3)
Sodium: 136 mmol/L (ref 135–146)
Total Bilirubin: 0.5 mg/dL (ref 0.2–1.2)
Total Protein: 6.9 g/dL (ref 6.1–8.1)

## 2019-01-17 LAB — LIPID PANEL
Cholesterol: 148 mg/dL (ref <200)
HDL: 48 mg/dL — ABNORMAL LOW (ref >=50)
LDL Cholesterol (Calc): 86 mg/dL (calc)
Non-HDL Cholesterol (Calc): 100 mg/dL (calc) (ref <130)
Total CHOL/HDL Ratio: 3.1 (calc) (ref <5.0)
Triglycerides: 53 mg/dL (ref <150)

## 2019-01-17 LAB — CBC WITH DIFFERENTIAL/PLATELET
Absolute Monocytes: 624 cells/uL (ref 200–950)
Basophils Absolute: 92 cells/uL (ref 0–200)
Basophils Relative: 1.2 %
Eosinophils Absolute: 208 cells/uL (ref 15–500)
Eosinophils Relative: 2.7 %
HCT: 39.5 % (ref 35.0–45.0)
Hemoglobin: 12.7 g/dL (ref 11.7–15.5)
Lymphs Abs: 1756 cells/uL (ref 850–3900)
MCH: 27 pg (ref 27.0–33.0)
MCHC: 32.2 g/dL (ref 32.0–36.0)
MCV: 83.9 fL (ref 80.0–100.0)
MPV: 10 fL (ref 7.5–12.5)
Monocytes Relative: 8.1 %
Neutro Abs: 5020 cells/uL (ref 1500–7800)
Neutrophils Relative %: 65.2 %
Platelets: 343 10*3/uL (ref 140–400)
RBC: 4.71 10*6/uL (ref 3.80–5.10)
RDW: 12.6 % (ref 11.0–15.0)
Total Lymphocyte: 22.8 %
WBC: 7.7 10*3/uL (ref 3.8–10.8)

## 2019-01-18 ENCOUNTER — Encounter: Payer: Self-pay | Admitting: Family Medicine

## 2019-02-15 MED ORDER — MELOXICAM 15 MG PO TABS: 15.0000 mg | ORAL_TABLET | Freq: Every day | ORAL | 2 refills | Status: DC

## 2019-02-15 NOTE — Addendum Note (Signed)
Addended by: Celene Skeen A on: 02/15/2019 02:12 PM   Modules accepted: Orders

## 2019-02-16 ENCOUNTER — Other Ambulatory Visit: Payer: Self-pay | Admitting: *Deleted

## 2019-02-16 NOTE — Telephone Encounter (Signed)
Received a request from patient's pharmacy stating that Diclofenac 75 mg was not covered by patient's insurance and that they needed a different medication.  I talked to Dr. Paulla Dolly and I sent over to patient's pharmacy on 02/15/19:  Meloxicam 15 mg #30 with 2 refills

## 2019-04-26 DIAGNOSIS — F919 Conduct disorder, unspecified: Secondary | ICD-10-CM | POA: Diagnosis not present

## 2019-04-26 DIAGNOSIS — F419 Anxiety disorder, unspecified: Secondary | ICD-10-CM | POA: Diagnosis not present

## 2019-04-26 DIAGNOSIS — F632 Kleptomania: Secondary | ICD-10-CM | POA: Diagnosis not present

## 2019-04-26 DIAGNOSIS — F79 Unspecified intellectual disabilities: Secondary | ICD-10-CM | POA: Diagnosis not present

## 2019-04-26 DIAGNOSIS — F331 Major depressive disorder, recurrent, moderate: Secondary | ICD-10-CM | POA: Diagnosis not present
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
## 2019-11-20 ENCOUNTER — Ambulatory Visit: Payer: Medicaid Other | Admitting: Podiatry

## 2019-12-01 ENCOUNTER — Ambulatory Visit: Payer: Medicaid Other | Admitting: Podiatry

## 2019-12-06 ENCOUNTER — Encounter: Payer: Self-pay | Admitting: Podiatry

## 2019-12-06 ENCOUNTER — Other Ambulatory Visit: Payer: Self-pay

## 2019-12-06 ENCOUNTER — Ambulatory Visit (INDEPENDENT_AMBULATORY_CARE_PROVIDER_SITE_OTHER): Payer: Medicaid Other | Admitting: Podiatry

## 2019-12-06 ENCOUNTER — Ambulatory Visit (INDEPENDENT_AMBULATORY_CARE_PROVIDER_SITE_OTHER): Payer: Medicaid Other

## 2019-12-06 DIAGNOSIS — M722 Plantar fascial fibromatosis: Secondary | ICD-10-CM

## 2019-12-06 NOTE — Progress Notes (Signed)
Subjective:   Patient ID: Leslie Ballard, female   DOB: 24 y.o.   MRN: [id number]   HPI Patient presents stating that she is having a flareup in her heels of both feet   ROS      Objective:  Physical Exam  Neurovascular status intact with patient had medication last year which was successful very extended period of time with inflammation of the medial fascial band bilateral acute plantar fascia     Assessment:  Acute plantar fasciitis bilateral with pain     Plan:  H&P reviewed condition sterile prepped and injected the plantar fascial bilateral 3 mg Kenalog 5 mg Xylocaine advised on support therapy and reappoint to recheck on an as-needed basis  X-rays indicate minimal spur formation moderate depression of the arch bilateral with no indications of advanced arthritic condition

## 2019-12-08 DIAGNOSIS — F919 Conduct disorder, unspecified: Secondary | ICD-10-CM | POA: Diagnosis not present

## 2019-12-08 DIAGNOSIS — F79 Unspecified intellectual disabilities: Secondary | ICD-10-CM | POA: Diagnosis not present

## 2019-12-08 DIAGNOSIS — F331 Major depressive disorder, recurrent, moderate: Secondary | ICD-10-CM | POA: Diagnosis not present

## 2019-12-08 DIAGNOSIS — F419 Anxiety disorder, unspecified: Secondary | ICD-10-CM | POA: Diagnosis not present

## 2020-01-01 ENCOUNTER — Other Ambulatory Visit: Payer: Self-pay

## 2020-01-01 ENCOUNTER — Ambulatory Visit (INDEPENDENT_AMBULATORY_CARE_PROVIDER_SITE_OTHER): Payer: Medicaid Other | Admitting: Family Medicine

## 2020-01-01 VITALS — BP 120/90 | HR 90 | Temp 97.9°F | Ht 65.0 in | Wt 280.0 lb

## 2020-01-01 DIAGNOSIS — N912 Amenorrhea, unspecified: Secondary | ICD-10-CM

## 2020-01-01 MED ORDER — NORETHINDRONE ACET-ETHINYL EST 1.5-30 MG-MCG PO TABS
1.0000 | ORAL_TABLET | Freq: Every day | ORAL | 11 refills | Status: DC
Start: 2020-01-01 — End: 2020-01-08

## 2020-01-01 NOTE — Progress Notes (Signed)
Subjective:    Patient ID: Leslie Ballard, female    DOB: [birth year], 24 y.o.   MRN: [id number]  HPI Wt Readings from Last 3 Encounters:  01/01/20 280 lb (127 kg)  01/13/19 264 lb (119.7 kg)  10/26/18 194 lb 0.1 oz (88 kg)   Patient is here today to get a refill on her birth control pills.  She is currently on Junel 1.5/30.  She states that there are "30 pills" in her packet that she takes every month.  However she has not had a menstrual cycle in over 4 months.  She denies any breast tenderness.  She denies any abdominal pain she denies any cramping.  She has gained substantial weight over the last year.  She is essentially up 86 pounds in 1 year!.  She is on Risperdal for bipolar through the care of her psychiatrist.  She denies skipping the placebo pills but she states that all the pills and her birth control packet look the same.  She denies any fevers or chills or night sweats or galactorrhea.  She denies any polyuria, polydipsia, or blurry vision.  She does have a family history of diabetes.  Past Medical History:  Diagnosis Date  . Bipolar 1 disorder (HCC)    Dr. Lester Kinsman [facility])   Current Outpatient Medications on File Prior to Visit  Medication Sig Dispense Refill  . carBAMazepine (TEGRETOL PO) Take 500 mg by mouth daily.    Marland Kitchen FLUoxetine (PROZAC) 20 MG tablet Take 20 mg by mouth daily. Take one pill in the morning    . Norethindrone Acetate-Ethinyl Estradiol (LOESTRIN) 1.5-30 MG-MCG tablet Take 1 tablet by mouth daily. 1 Package 11  . risperiDONE (RISPERDAL PO) Take 1 tablet by mouth 2 (two) times daily.    . meloxicam (MOBIC) 15 MG tablet Take 1 tablet (15 mg total) by mouth daily. (Patient not taking: Reported on 01/01/2020) 30 tablet 2   No current facility-administered medications on file prior to visit.   No past surgical history on file. No Known Allergies Social History   Socioeconomic History  . Marital status: Single    Spouse name: Not on file  . Number of  children: Not on file  . Years of education: Not on file  . Highest education level: Not on file  Occupational History  . Not on file  Tobacco Use  . Smoking status: Never Smoker  . Smokeless tobacco: Never Used  Vaping Use  . Vaping Use: Never used  Substance and Sexual Activity  . Alcohol use: No  . Drug use: No  . Sexual activity: Yes    Birth control/protection: Injection  Other Topics Concern  . Not on file  Social History Narrative  . Not on file   Social Determinants of Health   Financial Resource Strain:   . Difficulty of Paying Living Expenses: Not on file  Food Insecurity:   . Worried About Programme researcher, broadcasting/film/video in the Last Year: Not on file  . Ran Out of Food in the Last Year: Not on file  Transportation Needs:   . Lack of Transportation (Medical): Not on file  . Lack of Transportation (Non-Medical): Not on file  Physical Activity:   . Days of Exercise per Week: Not on file  . Minutes of Exercise per Session: Not on file  Stress:   . Feeling of Stress : Not on file  Social Connections:   . Frequency of Communication with Friends and Family: Not on file  .  Frequency of Social Gatherings with Friends and Family: Not on file  . Attends Religious Services: Not on file  . Active Member of Clubs or Organizations: Not on file  . Attends Banker Meetings: Not on file  . Marital Status: Not on file  Intimate Partner Violence:   . Fear of Current or Ex-Partner: Not on file  . Emotionally Abused: Not on file  . Physically Abused: Not on file  . Sexually Abused: Not on file   Family History  Problem Relation Age of Onset  . Diabetes Mother   . Hypertension Mother   . Hypertension Father   . Diabetes Father   . Diabetes Paternal Uncle   . Gout Paternal Uncle      Review of Systems     Objective:   Physical Exam Vitals reviewed.  Constitutional:      General: She is not in acute distress.    Appearance: Normal appearance. She is obese. She is  not ill-appearing, toxic-appearing or diaphoretic.  HENT:     Head: Normocephalic and atraumatic.     Right Ear: Tympanic membrane, ear canal and external ear normal. There is no impacted cerumen.     Left Ear: Tympanic membrane, ear canal and external ear normal. There is no impacted cerumen.     Nose: Nose normal. No congestion or rhinorrhea.     Mouth/Throat:     Mouth: Mucous membranes are moist.     Pharynx: Oropharynx is clear. No oropharyngeal exudate or posterior oropharyngeal erythema.  Eyes:     General: No scleral icterus.       Right eye: No discharge.        Left eye: No discharge.     Extraocular Movements: Extraocular movements intact.     Conjunctiva/sclera: Conjunctivae normal.     Pupils: Pupils are equal, round, and reactive to light.  Neck:     Vascular: No carotid bruit.  Cardiovascular:     Rate and Rhythm: Normal rate and regular rhythm.     Pulses: Normal pulses.     Heart sounds: Normal heart sounds. No murmur heard.  No friction rub. No gallop.   Pulmonary:     Effort: Pulmonary effort is normal. No respiratory distress.     Breath sounds: Normal breath sounds. No stridor. No wheezing, rhonchi or rales.  Chest:     Chest wall: No tenderness.  Abdominal:     General: Abdomen is flat. Bowel sounds are normal. There is no distension.     Palpations: Abdomen is soft. There is no mass.     Tenderness: There is no abdominal tenderness. There is no right CVA tenderness, left CVA tenderness, guarding or rebound.     Hernia: No hernia is present.  Musculoskeletal:        General: Normal range of motion.     Cervical back: Normal range of motion and neck supple. No rigidity. No muscular tenderness.     Right lower leg: No edema.     Left lower leg: No edema.  Lymphadenopathy:     Cervical: No cervical adenopathy.  Skin:    General: Skin is warm.     Coloration: Skin is not jaundiced or pale.     Findings: No bruising, erythema, lesion or rash.    Neurological:     General: No focal deficit present.     Mental Status: She is alert and oriented to person, place, and time. Mental status is at baseline.  Cranial Nerves: No cranial nerve deficit.     Sensory: No sensory deficit.     Motor: No weakness.     Coordination: Coordination normal.     Gait: Gait normal.     Deep Tendon Reflexes: Reflexes normal.  Psychiatric:        Mood and Affect: Mood normal.        Behavior: Behavior normal.        Thought Content: Thought content normal.        Judgment: Judgment normal.           Assessment & Plan:  Amenorrhea, unspecified - Plan: CBC with Differential/Platelet, COMPLETE METABOLIC PANEL WITH GFR, TSH, hCG, serum, qualitative  Pap smear is due next year however I am concerned by her amenorrhea.  She states that there is 30 pills in her packet which does not make sense.  She states that all the pills look the same.  I wonder if she is got a 21 tablet pack and is not taking 7 days off to have a.  And therefore is going from 1 cycle into the next preventing her from having a menstrual cycle.  Begin by obtaining a serum hCG to rule out pregnancy.  Check a TSH as well as a CBC and a CMP to evaluate for any evidence of diabetes or hormone abnormalities.  If all labs are normal, I will have the patient bring by her birth control so that we can review the packet to make sure that she is taking it appropriately.

## 2020-01-02 LAB — CBC WITH DIFFERENTIAL/PLATELET
Absolute Monocytes: 577 cells/uL (ref 200–950)
Basophils Absolute: 81 cells/uL (ref 0–200)
Basophils Relative: 1.1 %
Eosinophils Absolute: 192 cells/uL (ref 15–500)
Eosinophils Relative: 2.6 %
HCT: 43 % (ref 35.0–45.0)
Hemoglobin: 13.6 g/dL (ref 11.7–15.5)
Lymphs Abs: 1991 cells/uL (ref 850–3900)
MCH: 26.6 pg — ABNORMAL LOW (ref 27.0–33.0)
MCHC: 31.6 g/dL — ABNORMAL LOW (ref 32.0–36.0)
MCV: 84 fL (ref 80.0–100.0)
MPV: 10 fL (ref 7.5–12.5)
Monocytes Relative: 7.8 %
Neutro Abs: 4558 cells/uL (ref 1500–7800)
Neutrophils Relative %: 61.6 %
Platelets: 348 10*3/uL (ref 140–400)
RBC: 5.12 10*6/uL — ABNORMAL HIGH (ref 3.80–5.10)
RDW: 12.6 % (ref 11.0–15.0)
Total Lymphocyte: 26.9 %
WBC: 7.4 10*3/uL (ref 3.8–10.8)

## 2020-01-02 LAB — COMPLETE METABOLIC PANEL WITH GFR
AG Ratio: 1.1 (calc) (ref 1.0–2.5)
ALT: 10 U/L (ref 6–29)
AST: 17 U/L (ref 10–30)
Albumin: 3.9 g/dL (ref 3.6–5.1)
Alkaline phosphatase (APISO): 103 U/L (ref 31–125)
BUN: 11 mg/dL (ref 7–25)
CO2: 23 mmol/L (ref 20–32)
Calcium: 9.3 mg/dL (ref 8.6–10.2)
Chloride: 103 mmol/L (ref 98–110)
Creat: 0.75 mg/dL (ref 0.50–1.10)
GFR, Est African American: 127 mL/min/{1.73_m2} (ref >=60)
GFR, Est Non African American: 110 mL/min/{1.73_m2} (ref >=60)
Globulin: 3.4 g/dL (calc) (ref 1.9–3.7)
Glucose, Bld: 88 mg/dL (ref 65–99)
Potassium: 4.5 mmol/L (ref 3.5–5.3)
Sodium: 136 mmol/L (ref 135–146)
Total Bilirubin: 0.3 mg/dL (ref 0.2–1.2)
Total Protein: 7.3 g/dL (ref 6.1–8.1)

## 2020-01-02 LAB — HCG, SERUM, QUALITATIVE: Preg, Serum: NEGATIVE

## 2020-01-02 LAB — TSH: TSH: 1.38 mIU/L

## 2020-01-08 ENCOUNTER — Telehealth: Payer: Self-pay | Admitting: Family Medicine

## 2020-01-08 ENCOUNTER — Other Ambulatory Visit: Payer: Self-pay | Admitting: Family Medicine

## 2020-01-08 MED ORDER — NORETHINDRONE ACET-ETHINYL EST 1.5-30 MG-MCG PO TABS
1.0000 | ORAL_TABLET | Freq: Every day | ORAL | 11 refills | Status: DC
Start: 2020-01-08 — End: 2021-01-08

## 2020-01-08 NOTE — Telephone Encounter (Signed)
Please Advise

## 2020-01-08 NOTE — Telephone Encounter (Signed)
Pt stop by to let Dr.Pickard know that she was taken Junel 1.5 mg-30 mcg tablet need a refill      [facility] - [place], [place] - [address]

## 2020-04-15 ENCOUNTER — Other Ambulatory Visit: Payer: Self-pay

## 2020-04-15 ENCOUNTER — Ambulatory Visit (INDEPENDENT_AMBULATORY_CARE_PROVIDER_SITE_OTHER): Payer: Medicaid Other | Admitting: Family Medicine

## 2020-04-15 DIAGNOSIS — Z23 Encounter for immunization: Secondary | ICD-10-CM

## 2020-06-05 ENCOUNTER — Ambulatory Visit (INDEPENDENT_AMBULATORY_CARE_PROVIDER_SITE_OTHER): Payer: Medicaid Other | Admitting: Podiatry

## 2020-06-05 ENCOUNTER — Encounter: Payer: Self-pay | Admitting: Podiatry

## 2020-06-05 ENCOUNTER — Other Ambulatory Visit: Payer: Self-pay

## 2020-06-05 DIAGNOSIS — M722 Plantar fascial fibromatosis: Secondary | ICD-10-CM | POA: Diagnosis not present

## 2020-06-05 MED ORDER — TRIAMCINOLONE ACETONIDE 10 MG/ML IJ SUSP
10.0000 mg | Freq: Once | INTRAMUSCULAR | Status: AC
  Administered 2020-06-05: 10 mg

## 2020-06-05 NOTE — Progress Notes (Signed)
Subjective:   Patient ID: Leslie Ballard, female   DOB: 25 y.o.   MRN: [id number]   HPI Patient presents stating she developed pain in her heels again   ROS      Objective:  Physical Exam  Neurovascular status intact exquisite discomfort plantar fascial bilateral heel region insertional point tendon calcaneus     Assessment:  Acute plantar fasciitis bilateral      Plan:  H&P sterile prep we injected the fascia bilateral 3 mg Kenalog 5 mg Xylocaine applied Band-Aids reappoint as symptoms indicate

## 2020-08-21 ENCOUNTER — Telehealth: Payer: Self-pay | Admitting: *Deleted

## 2020-08-21 NOTE — Telephone Encounter (Signed)
Patient mother tested positive for COVID on 08/19/2020.   Sx include cough, congestion, HA.  Advised to continue symptom management with OTC medications: Tylenol/ Ibuprofen for fever/ body aches, Mucinex/ Delsym for cough/ chest congestion, Afrin/Sudafed/nasal saline for sinus pressure/ nasal congestion  If chest pain, shortness of breath, fever >104 that is unresponsive to antipyretics noted, advised to go to ER for evaluation.   Advised that the CDC recommends the following criteria prior to ending isolation in vaccinated persons at least 5 days since symptoms onset, then masking x5 days AND 3 days fever free without antipyretics (Tylenol or Ibuprofen) AND improvement in respiratory symptoms.

## 2020-10-28 ENCOUNTER — Ambulatory Visit: Payer: Medicaid Other | Admitting: Family Medicine

## 2020-11-13 DIAGNOSIS — F419 Anxiety disorder, unspecified: Secondary | ICD-10-CM | POA: Diagnosis not present

## 2020-11-13 DIAGNOSIS — F632 Kleptomania: Secondary | ICD-10-CM | POA: Diagnosis not present

## 2020-11-13 DIAGNOSIS — F919 Conduct disorder, unspecified: Secondary | ICD-10-CM | POA: Diagnosis not present

## 2020-11-13 DIAGNOSIS — F331 Major depressive disorder, recurrent, moderate: Secondary | ICD-10-CM | POA: Diagnosis not present

## 2020-11-13 DIAGNOSIS — F79 Unspecified intellectual disabilities: Secondary | ICD-10-CM | POA: Diagnosis not present

## 2020-12-09 ENCOUNTER — Ambulatory Visit: Payer: Medicaid Other | Admitting: Podiatry

## 2020-12-11 ENCOUNTER — Ambulatory Visit: Payer: Medicaid Other | Admitting: Podiatry

## 2020-12-13 ENCOUNTER — Ambulatory Visit: Payer: Medicaid Other | Admitting: Podiatry

## 2020-12-16 ENCOUNTER — Ambulatory Visit (INDEPENDENT_AMBULATORY_CARE_PROVIDER_SITE_OTHER): Payer: Medicaid Other | Admitting: Podiatry

## 2020-12-16 ENCOUNTER — Other Ambulatory Visit: Payer: Self-pay

## 2020-12-16 ENCOUNTER — Ambulatory Visit (INDEPENDENT_AMBULATORY_CARE_PROVIDER_SITE_OTHER): Payer: Medicaid Other

## 2020-12-16 DIAGNOSIS — M722 Plantar fascial fibromatosis: Secondary | ICD-10-CM

## 2020-12-17 NOTE — Progress Notes (Signed)
Subjective:   Patient ID: Leslie Ballard, female   DOB: 25 y.o.   MRN: [id number]   HPI Patient presents stating that she is having a lot of pain still in her heels and the last time we did this we had minimal success and it has been very tender and only had an approximate 1 month of relief of symptoms.  States it is keeping her from being active and she needs to try to be active to lose weight and states her right hurts more than her left   ROS      Objective:  Physical Exam  Her status was found to be intact good digital perfusion noted with exquisite discomfort in the medial fascial band of the right plantar fascia at the insertion of the tendon into the calcaneus     Assessment:  Acute plantar fasciitis right that so far has not responded to conservative care and seems to be getting worse over the last 18 months     Plan:  Reviewed condition at great length and also discussed with mother.  I do think that long-term surgical intervention is necessary given the amount of pain she is having and her weight gain because of inactivity.  I did allow her to read consent form going over all risk factors alternative treatments and she is willing to accept this wants surgery and after extensive review signed consent form and is scheduled for outpatient surgery.  Patient is encouraged to call with all questions understands total recovery can take 6 months to 1 year and there is no long-term guarantees we can get this better and at times they will develop arch pain or lateral pain that may need to be dealt with.  Patient is comfortable with all this is his mother and consent form was signed surgery scheduled

## 2021-01-06 MED ORDER — HYDROCODONE-ACETAMINOPHEN 10-325 MG PO TABS: 1.0000 | ORAL_TABLET | Freq: Three times a day (TID) | ORAL | 0 refills | Status: AC | PRN

## 2021-01-06 NOTE — Addendum Note (Signed)
Addended by: Lenn Sink on: 01/06/2021 01:52 PM   Modules accepted: Orders

## 2021-01-07 ENCOUNTER — Encounter: Payer: Self-pay | Admitting: Podiatry

## 2021-01-07 DIAGNOSIS — M722 Plantar fascial fibromatosis: Secondary | ICD-10-CM | POA: Diagnosis not present

## 2021-01-08 ENCOUNTER — Other Ambulatory Visit: Payer: Self-pay | Admitting: Family Medicine

## 2021-01-08 ENCOUNTER — Other Ambulatory Visit: Payer: Self-pay

## 2021-01-09 ENCOUNTER — Other Ambulatory Visit: Payer: Self-pay

## 2021-01-09 ENCOUNTER — Ambulatory Visit (INDEPENDENT_AMBULATORY_CARE_PROVIDER_SITE_OTHER): Payer: Medicaid Other | Admitting: *Deleted

## 2021-01-09 DIAGNOSIS — Z23 Encounter for immunization: Secondary | ICD-10-CM | POA: Diagnosis not present

## 2021-01-10 ENCOUNTER — Ambulatory Visit: Payer: Medicaid Other | Admitting: Family Medicine

## 2021-01-13 ENCOUNTER — Ambulatory Visit (INDEPENDENT_AMBULATORY_CARE_PROVIDER_SITE_OTHER): Payer: Medicaid Other | Admitting: Podiatry

## 2021-01-13 ENCOUNTER — Other Ambulatory Visit: Payer: Self-pay

## 2021-01-13 ENCOUNTER — Encounter: Payer: Self-pay | Admitting: Podiatry

## 2021-01-13 DIAGNOSIS — M722 Plantar fascial fibromatosis: Secondary | ICD-10-CM

## 2021-01-15 NOTE — Progress Notes (Signed)
Subjective:   Patient ID: Leslie Ballard, female   DOB: 25 y.o.   MRN: [id number]   HPI Patient presents with good healing of the endoscopic surgery right stating she is having minimal discomfort wearing her boot neuro   ROS      Objective:  Physical Exam  Vascular status intact negative Denna Haggard' sign noted wound edges well coapted medial lateral right heel with stitches intact no drainage     Assessment:  Doing well post endoscopic surgery right     Plan:  Sterile dressing reapplied continue boot usage reappoint 2 weeks suture removal or earlier if needed

## 2021-01-16 ENCOUNTER — Ambulatory Visit: Payer: Medicaid Other | Admitting: Family Medicine

## 2021-02-03 ENCOUNTER — Encounter: Payer: Medicaid Other | Admitting: Podiatry

## 2021-02-05 ENCOUNTER — Other Ambulatory Visit: Payer: Self-pay

## 2021-02-05 ENCOUNTER — Encounter: Payer: Self-pay | Admitting: Podiatry

## 2021-02-05 ENCOUNTER — Ambulatory Visit (INDEPENDENT_AMBULATORY_CARE_PROVIDER_SITE_OTHER): Payer: Self-pay | Admitting: Podiatry

## 2021-02-05 DIAGNOSIS — M722 Plantar fascial fibromatosis: Secondary | ICD-10-CM

## 2021-02-05 NOTE — Progress Notes (Signed)
Subjective:   Patient ID: Leslie Ballard, female   DOB: 25 y.o.   MRN: [id number]   HPI Patient presents stating that she is doing well with only mild discomfort and some itching in her foot   ROS      Objective:  Physical Exam  Neurovascular status intact negative Denna Haggard' sign noted wound edges well coapted medial lateral side right heel     Assessment:  Doing well post endoscopic surgery right     Plan:  She is removed gradual return to soft shoe gear encouraged her to call questions concerns and so far is responding well.  May gradually begin wearing shoe gear and and hopefully can be full-time in the next 3 to 4 weeks

## 2021-03-05 ENCOUNTER — Other Ambulatory Visit: Payer: Self-pay | Admitting: Family Medicine

## 2021-03-20 ENCOUNTER — Encounter: Payer: Self-pay | Admitting: Family Medicine

## 2021-03-20 ENCOUNTER — Ambulatory Visit (INDEPENDENT_AMBULATORY_CARE_PROVIDER_SITE_OTHER): Payer: Medicaid Other | Admitting: Family Medicine

## 2021-03-20 ENCOUNTER — Other Ambulatory Visit: Payer: Self-pay

## 2021-03-20 NOTE — Progress Notes (Signed)
Subjective:    Patient ID: Leslie Ballard, female    DOB: 1996-01-23, 26 y.o.   MRN: [id number]  HPI  Patient is a very pleasant 26 year old African-American female here today for complete physical exam.  She denies any concerns.  She is still taking her birth control pills.  She is on a 21-day pack of birth control pills.  She is not taking any placebo pills and as result she is not having a menstrual cycle.  She is taking 21 days straight of active pills and then rolling into the next pack.  She denies any chest pain shortness of breath or dyspnea on exertion.  She denies any abdominal pain or menorrhagia or menorrhagia.  She is due for her COVID booster.  She is already had her flu shot.  She is also due for Pap smear.  However she request a female provider to perform her Pap smear.  She is overdue for fasting lab work.  She is always Rinaldo Ratel and therefore she is at higher risk of metabolic syndrome, hyperlipidemia, and hyperglycemia due to her morbid obesity with a BMI of 45  Past Medical History:  Diagnosis Date   Bipolar 1 disorder (HCC)    Dr. Lester [facility]insman ([facility])     History reviewed. No pertinent surgical history. No Known Allergies Social History   Socioeconomic History   Marital status: Single    Spouse name: Not on file   Number of children: Not on file   Years of education: Not on file   Highest education level: Not on file  Occupational History   Not on file  Tobacco Use   Smoking status: Never   Smokeless tobacco: Never  Vaping Use   Vaping Use: Never used  Substance and Sexual Activity   Alcohol use: No   Drug use: No   Sexual activity: Yes    Birth control/protection: Injection  Other Topics Concern   Not on file  Social History Narrative   Not on file   Social Determinants of Health   Financial Resource Strain: Not on file  Food Insecurity: Not on file  Transportation Needs: Not on file  Physical Activity: Not on file  Stress: Not on file  Social  Connections: Not on file  Intimate Partner Violence: Not on file   Family History  Problem Relation Age of Onset   Diabetes Mother    Hypertension Mother    Hypertension Father    Diabetes Father    Diabetes Paternal Uncle    Gout Paternal Uncle      Review of Systems     Objective:   Physical Exam Vitals reviewed.  Constitutional:      General: She is not in acute distress.    Appearance: Normal appearance. She is obese. She is not ill-appearing, toxic-appearing or diaphoretic.  HENT:     Head: Normocephalic and atraumatic.     Right Ear: Tympanic membrane, ear canal and external ear normal. There is no impacted cerumen.     Left Ear: Tympanic membrane, ear canal and external ear normal. There is no impacted cerumen.     Nose: Nose normal. No congestion or rhinorrhea.     Mouth/Throat:     Mouth: Mucous membranes are moist.     Pharynx: Oropharynx is clear. No oropharyngeal exudate or posterior oropharyngeal erythema.  Eyes:     General: No scleral icterus.       Right eye: No discharge.        Left  eye: No discharge.     Extraocular Movements: Extraocular movements intact.     Conjunctiva/sclera: Conjunctivae normal.     Pupils: Pupils are equal, round, and reactive to light.  Neck:     Vascular: No carotid bruit.  Cardiovascular:     Rate and Rhythm: Normal rate and regular rhythm.     Pulses: Normal pulses.     Heart sounds: Normal heart sounds. No murmur heard.   No friction rub. No gallop.  Pulmonary:     Effort: Pulmonary effort is normal. No respiratory distress.     Breath sounds: Normal breath sounds. No stridor. No wheezing, rhonchi or rales.  Chest:     Chest wall: No tenderness.  Abdominal:     General: Abdomen is flat. Bowel sounds are normal. There is no distension.     Palpations: Abdomen is soft. There is no mass.     Tenderness: There is no abdominal tenderness. There is no right CVA tenderness, left CVA tenderness, guarding or rebound.      Hernia: No hernia is present.  Musculoskeletal:        General: Normal range of motion.     Cervical back: Normal range of motion and neck supple. No rigidity. No muscular tenderness.     Right lower leg: No edema.     Left lower leg: No edema.  Lymphadenopathy:     Cervical: No cervical adenopathy.  Skin:    General: Skin is warm.     Coloration: Skin is not jaundiced or pale.     Findings: No bruising, erythema, lesion or rash.  Neurological:     General: No focal deficit present.     Mental Status: She is alert and oriented to person, place, and time. Mental status is at baseline.     Cranial Nerves: No cranial nerve deficit.     Sensory: No sensory deficit.     Motor: No weakness.     Coordination: Coordination normal.     Gait: Gait normal.     Deep Tendon Reflexes: Reflexes normal.  Psychiatric:        Mood and Affect: Mood normal.        Behavior: Behavior normal.        Thought Content: Thought content normal.        Judgment: Judgment normal.          Assessment & Plan:  Morbid obesity due to excess calories (HCC) - Plan: CBC with Differential/Platelet, COMPLETE METABOLIC PANEL WITH GFR, Lipid panel Physical exam was performed today.  I recommended that she return fasting for CBC CMP and lipid panel.  Goal LDL cholesterol is less than 100.  If the patient is prediabetic, she may benefit from a GLP-1 agonist also help facilitate weight loss.  Recommended a Pap smear.  Recommended a booster on the COVID shot.  Mammogram is not due until 38.  Colonoscopy is not due until 43.  Recommend 30 minutes a day 5 days a week of aerobic exercise.  Recommend a low carbohydrate diet.  Recommended the patient avoid sodas.  She will return for a Pap smear with a female provider at her convenience

## 2021-03-21 ENCOUNTER — Encounter: Payer: Self-pay | Admitting: Nurse Practitioner

## 2021-03-21 ENCOUNTER — Ambulatory Visit (INDEPENDENT_AMBULATORY_CARE_PROVIDER_SITE_OTHER): Payer: Medicaid Other | Admitting: Nurse Practitioner

## 2021-03-21 VITALS — BP 152/90 | HR 90 | Ht 65.0 in | Wt 270.0 lb

## 2021-03-21 DIAGNOSIS — N898 Other specified noninflammatory disorders of vagina: Secondary | ICD-10-CM | POA: Diagnosis not present

## 2021-03-21 DIAGNOSIS — Z124 Encounter for screening for malignant neoplasm of cervix: Secondary | ICD-10-CM

## 2021-03-21 LAB — WET PREP FOR TRICH, YEAST, CLUE

## 2021-03-21 NOTE — Progress Notes (Signed)
Subjective:    Patient ID: Leslie Ballard, female    DOB: Apr 03, 1995, 26 y.o.   MRN: [id number]  HPI: Leslie Ballard is a 26 y.o. female presenting for pap smear.  Chief Complaint  Patient presents with   Gynecologic Exam   Patient is here with mom for gynecologic exam.  She is having some vaginal discharge today.  Denies itching, odor, dysuria.   Does not have periods on OCP, is not sexually active.  No Known Allergies   Patient Active Problem List   Diagnosis Date Noted   Menorrhagia 12/15/2018    Past Medical History:  Diagnosis Date   Bipolar 1 disorder ([place])    Dr. Margarito Liner ([facility])    Relevant past medical, surgical, family and social history reviewed and updated as indicated. Interim medical history since our last visit reviewed.  Review of Systems Per HPI unless specifically indicated above     Objective:    BP (!) 152/90    Pulse 90    Ht 5\' 5"  (1.651 m)    Wt 270 lb (122.5 kg)    SpO2 97%    BMI 44.93 kg/m   Wt Readings from Last 3 Encounters:  03/21/21 270 lb (122.5 kg)  03/20/21 274 lb (124.3 kg)  01/01/20 280 lb (127 kg)    Physical Exam Vitals and nursing note reviewed. Exam conducted with a chaperone present Elizabeth Palau, CMA).  Constitutional:      General: She is not in acute distress.    Appearance: Normal appearance. She is obese. She is not toxic-appearing.  Chest:  Breasts:    Breasts are symmetrical.     Right: Normal. No inverted nipple, mass, nipple discharge, skin change or tenderness.     Left: Normal. No inverted nipple, mass, nipple discharge, skin change or tenderness.  Genitourinary:    General: Normal vulva.     Exam position: Lithotomy position.     Pubic Area: No rash.      Labia:        Right: No rash.        Left: No rash.      Vagina: Vaginal discharge present. No tenderness or bleeding.     Cervix: Normal.     Uterus: Normal.      Adnexa: Right adnexa normal and left adnexa normal.       Right: No mass,  tenderness or fullness.         Left: No mass, tenderness or fullness.       Comments: Thin, white non odorous discharge Lymphadenopathy:     Upper Body:     Right upper body: No supraclavicular, axillary or pectoral adenopathy.     Left upper body: No supraclavicular, axillary or pectoral adenopathy.     Lower Body: No right inguinal adenopathy. No left inguinal adenopathy.  Neurological:     Mental Status: She is alert and oriented to person, place, and time.  Psychiatric:        Attention and Perception: Attention and perception normal.        Mood and Affect: Affect normal. Mood is anxious.        Speech: Speech normal.        Behavior: Behavior normal.        Thought Content: Thought content normal.        Cognition and Memory: Cognition normal.        Judgment: Judgment normal.     Comments: Nervous-appearing  Assessment & Plan:   Problem List Items Addressed This Visit   None Visit Diagnoses     Screening for cervical cancer    -  Primary   Relevant Orders   Pap IG w/ reflex to HPV when ASC-U   Vaginal discharge       Wet prep is negative, check g/c although she denies sexual activity.   Relevant Orders   WET PREP FOR [place], [place] (Completed)   C. trachomatis/N. gonorrhoeae RNA        Follow up plan: Return if symptoms worsen or fail to improve.

## 2021-03-22 LAB — LIPID PANEL
Cholesterol: 128 mg/dL (ref <200)
HDL: 35 mg/dL — ABNORMAL LOW (ref >=50)
LDL Cholesterol (Calc): 75 mg/dL (calc)
Non-HDL Cholesterol (Calc): 93 mg/dL (calc) (ref <130)
Total CHOL/HDL Ratio: 3.7 (calc) (ref <5.0)
Triglycerides: 94 mg/dL (ref <150)

## 2021-03-22 LAB — COMPLETE METABOLIC PANEL WITH GFR
AG Ratio: 1.1 (calc) (ref 1.0–2.5)
ALT: 13 U/L (ref 6–29)
AST: 16 U/L (ref 10–30)
Albumin: 3.8 g/dL (ref 3.6–5.1)
Alkaline phosphatase (APISO): 70 U/L (ref 31–125)
BUN: 8 mg/dL (ref 7–25)
CO2: 24 mmol/L (ref 20–32)
Calcium: 9.3 mg/dL (ref 8.6–10.2)
Chloride: 105 mmol/L (ref 98–110)
Creat: 0.8 mg/dL (ref 0.50–0.96)
Globulin: 3.4 g/dL (calc) (ref 1.9–3.7)
Glucose, Bld: 84 mg/dL (ref 65–99)
Potassium: 4.3 mmol/L (ref 3.5–5.3)
Sodium: 137 mmol/L (ref 135–146)
Total Bilirubin: 0.6 mg/dL (ref 0.2–1.2)
Total Protein: 7.2 g/dL (ref 6.1–8.1)
eGFR: 104 mL/min/{1.73_m2} (ref >=60)

## 2021-03-22 LAB — CBC WITH DIFFERENTIAL/PLATELET
Absolute Monocytes: 441 cells/uL (ref 200–950)
Basophils Absolute: 49 cells/uL (ref 0–200)
Basophils Relative: 0.7 %
Eosinophils Absolute: 140 cells/uL (ref 15–500)
Eosinophils Relative: 2 %
HCT: 40.8 % (ref 35.0–45.0)
Hemoglobin: 13.3 g/dL (ref 11.7–15.5)
Lymphs Abs: 1582 cells/uL (ref 850–3900)
MCH: 27.8 pg (ref 27.0–33.0)
MCHC: 32.6 g/dL (ref 32.0–36.0)
MCV: 85.4 fL (ref 80.0–100.0)
MPV: 10.5 fL (ref 7.5–12.5)
Monocytes Relative: 6.3 %
Neutro Abs: 4788 cells/uL (ref 1500–7800)
Neutrophils Relative %: 68.4 %
Platelets: 361 10*3/uL (ref 140–400)
RBC: 4.78 10*6/uL (ref 3.80–5.10)
RDW: 12.3 % (ref 11.0–15.0)
Total Lymphocyte: 22.6 %
WBC: 7 10*3/uL (ref 3.8–10.8)

## 2021-03-24 ENCOUNTER — Telehealth: Payer: Self-pay

## 2021-03-24 LAB — C. TRACHOMATIS/N. GONORRHOEAE RNA
C. trachomatis RNA, TMA: NOT DETECTED
N. gonorrhoeae RNA, TMA: NOT DETECTED

## 2021-03-24 NOTE — Telephone Encounter (Signed)
Patient aware of results and recommendations. °

## 2021-03-24 NOTE — Telephone Encounter (Signed)
-----   Message from Donita Brooks, MD sent at 03/24/2021  6:38 AM EST ----- Labs and cholesterol look good.

## 2021-03-25 LAB — PAP IG W/ RFLX HPV ASCU

## 2021-03-26 ENCOUNTER — Telehealth: Payer: Self-pay

## 2021-03-26 NOTE — Telephone Encounter (Signed)
-----   Message from Valentino Nose, NP sent at 03/26/2021  9:20 AM EST ----- Please notify pap test has come back normal, can repeat in 3-5 years.  Gonorrhea/chlamydia testing negative

## 2021-03-26 NOTE — Telephone Encounter (Signed)
Patient aware of results.

## 2021-04-10 DIAGNOSIS — F79 Unspecified intellectual disabilities: Secondary | ICD-10-CM | POA: Diagnosis not present

## 2021-04-10 DIAGNOSIS — F331 Major depressive disorder, recurrent, moderate: Secondary | ICD-10-CM | POA: Diagnosis not present

## 2021-04-10 DIAGNOSIS — F919 Conduct disorder, unspecified: Secondary | ICD-10-CM | POA: Diagnosis not present

## 2021-04-10 DIAGNOSIS — F419 Anxiety disorder, unspecified: Secondary | ICD-10-CM | POA: Diagnosis not present

## 2021-04-28 ENCOUNTER — Other Ambulatory Visit: Payer: Self-pay | Admitting: Family Medicine

## 2021-11-06 DIAGNOSIS — F79 Unspecified intellectual disabilities: Secondary | ICD-10-CM | POA: Diagnosis not present

## 2021-11-06 DIAGNOSIS — F919 Conduct disorder, unspecified: Secondary | ICD-10-CM | POA: Diagnosis not present

## 2021-11-06 DIAGNOSIS — F632 Kleptomania: Secondary | ICD-10-CM | POA: Diagnosis not present

## 2021-11-06 DIAGNOSIS — F419 Anxiety disorder, unspecified: Secondary | ICD-10-CM | POA: Diagnosis not present

## 2021-11-06 DIAGNOSIS — F331 Major depressive disorder, recurrent, moderate: Secondary | ICD-10-CM | POA: Diagnosis not present

## 2022-01-07 ENCOUNTER — Other Ambulatory Visit: Payer: Self-pay | Admitting: Family Medicine

## 2022-01-07 MED ORDER — NORETHINDRONE ACET-ETHINYL EST 1.5-30 MG-MCG PO TABS: ORAL_TABLET | ORAL | 5 refills | Status: DC

## 2022-02-02 DIAGNOSIS — F79 Unspecified intellectual disabilities: Secondary | ICD-10-CM | POA: Diagnosis not present

## 2022-02-02 DIAGNOSIS — F632 Kleptomania: Secondary | ICD-10-CM | POA: Diagnosis not present

## 2022-02-02 DIAGNOSIS — F419 Anxiety disorder, unspecified: Secondary | ICD-10-CM | POA: Diagnosis not present

## 2022-02-02 DIAGNOSIS — F919 Conduct disorder, unspecified: Secondary | ICD-10-CM | POA: Diagnosis not present

## 2022-02-02 DIAGNOSIS — F331 Major depressive disorder, recurrent, moderate: Secondary | ICD-10-CM | POA: Diagnosis not present

## 2022-04-21 DIAGNOSIS — F919 Conduct disorder, unspecified: Secondary | ICD-10-CM | POA: Diagnosis not present

## 2022-04-23 DIAGNOSIS — F79 Unspecified intellectual disabilities: Secondary | ICD-10-CM | POA: Diagnosis not present

## 2022-04-23 DIAGNOSIS — F632 Kleptomania: Secondary | ICD-10-CM | POA: Diagnosis not present

## 2022-04-23 DIAGNOSIS — F919 Conduct disorder, unspecified: Secondary | ICD-10-CM | POA: Diagnosis not present

## 2022-04-23 DIAGNOSIS — F419 Anxiety disorder, unspecified: Secondary | ICD-10-CM | POA: Diagnosis not present

## 2022-04-23 DIAGNOSIS — F331 Major depressive disorder, recurrent, moderate: Secondary | ICD-10-CM | POA: Diagnosis not present

## 2022-06-22 ENCOUNTER — Telehealth: Payer: Self-pay | Admitting: Family Medicine

## 2022-06-22 NOTE — Telephone Encounter (Signed)
Patient appointment scheduled for 06/26/22 at 2pm to follow up on Mood. AS, CMA

## 2022-06-26 ENCOUNTER — Ambulatory Visit (INDEPENDENT_AMBULATORY_CARE_PROVIDER_SITE_OTHER): Payer: Medicaid Other | Admitting: Family Medicine

## 2022-06-26 VITALS — BP 126/72 | HR 78 | Temp 98.2°F | Ht 66.0 in | Wt 271.6 lb

## 2022-06-26 DIAGNOSIS — Z13220 Encounter for screening for lipoid disorders: Secondary | ICD-10-CM | POA: Diagnosis not present

## 2022-06-26 MED ORDER — MELOXICAM 15 MG PO TABS: 15.0000 mg | ORAL_TABLET | Freq: Every day | ORAL | 0 refills | Status: AC

## 2022-06-26 NOTE — Progress Notes (Signed)
Subjective:    Patient ID: Leslie Ballard, female    DOB: 07-09-1995, 27 y.o.   MRN: [id number]  HPI  Patient is a very pleasant 27 year old African-American female here today for complete physical exam.  She denies any concerns.  She is still taking her birth control pills.  She is on a 21-day pack of birth control pills.  She is not taking any placebo pills and as result she is not having a menstrual cycle.  She is taking 21 days straight of active pills and then rolling into the next pack.  She denies any chest pain shortness of breath or dyspnea on exertion.  She denies any abdominal pain or menorrhagia or menorrhagia.  She sees a psychiatrist who is prescribing her Tegretol, Risperdal, and Prozac.  She reports pain in both feet.  She states that she had surgery for plantar fasciitis in the past.  She reports pain on the plantar aspect of both feet also radiating into the lateral aspect of her right ankle.  She is not taking any type of NSAID.  She is due for fasting lab work.  Her Pap smear was performed last year and was normal Past Medical History:  Diagnosis Date   Bipolar 1 disorder (HCC)    Dr. Lester Kinsman [facility])    Current Outpatient Medications on File Prior to Visit  Medication Sig Dispense Refill   FLUoxetine (PROZAC) 20 MG capsule Take 20 mg by mouth every morning.     Norethindrone Acetate-Ethinyl Estradiol (LOESTRIN) 1.5-30 MG-MCG tablet TAKE 1 TABLET BY MOUTH DAILY. 21 tablet 5   risperiDONE (RISPERDAL) 1 MG tablet Take 1 mg by mouth 2 (two) times daily.     TEGRETOL-XR 400 MG 12 hr tablet Take 400 mg by mouth daily.     No current facility-administered medications on file prior to visit.    No past surgical history on file. No Known Allergies Social History   Socioeconomic History   Marital status: Single    Spouse name: Not on file   Number of children: Not on file   Years of education: Not on file   Highest education level: Not on file  Occupational History    Not on file  Tobacco Use   Smoking status: Never   Smokeless tobacco: Never  Vaping Use   Vaping Use: Never used  Substance and Sexual Activity   Alcohol use: No   Drug use: No   Sexual activity: Yes    Birth control/protection: Injection  Other Topics Concern   Not on file  Social History Narrative   Not on file   Social Determinants of Health   Financial Resource Strain: Not on file  Food Insecurity: Not on file  Transportation Needs: No Transportation Needs (06/22/2022)   PRAPARE - Transportation    Lack of Transportation (Medical): No    Lack of Transportation (Non-Medical): No  Physical Activity: Not on file  Stress: Not on file  Social Connections: Not on file  Intimate Partner Violence: Not on file   Family History  Problem Relation Age of Onset   Diabetes Mother    Hypertension Mother    Hypertension Father    Diabetes Father    Diabetes Paternal Uncle    Gout Paternal Uncle      Review of Systems     Objective:   Physical Exam Vitals reviewed.  Constitutional:      General: She is not in acute distress.    Appearance: Normal appearance. She  is obese. She is not ill-appearing, toxic-appearing or diaphoretic.  HENT:     Head: Normocephalic and atraumatic.     Right Ear: Tympanic membrane, ear canal and external ear normal. There is no impacted cerumen.     Left Ear: Tympanic membrane, ear canal and external ear normal. There is no impacted cerumen.     Nose: Nose normal. No congestion or rhinorrhea.     Mouth/Throat:     Mouth: Mucous membranes are moist.     Pharynx: Oropharynx is clear. No oropharyngeal exudate or posterior oropharyngeal erythema.  Eyes:     General: No scleral icterus.       Right eye: No discharge.        Left eye: No discharge.     Extraocular Movements: Extraocular movements intact.     Conjunctiva/sclera: Conjunctivae normal.     Pupils: Pupils are equal, round, and reactive to light.  Neck:     Vascular: No carotid bruit.   Cardiovascular:     Rate and Rhythm: Normal rate and regular rhythm.     Pulses: Normal pulses.     Heart sounds: Normal heart sounds. No murmur heard.    No friction rub. No gallop.  Pulmonary:     Effort: Pulmonary effort is normal. No respiratory distress.     Breath sounds: Normal breath sounds. No stridor. No wheezing, rhonchi or rales.  Chest:     Chest wall: No tenderness.  Abdominal:     General: Abdomen is flat. Bowel sounds are normal. There is no distension.     Palpations: Abdomen is soft. There is no mass.     Tenderness: There is no abdominal tenderness. There is no right CVA tenderness, left CVA tenderness, guarding or rebound.     Hernia: No hernia is present.  Musculoskeletal:        General: Normal range of motion.     Cervical back: Normal range of motion and neck supple. No rigidity. No muscular tenderness.     Right lower leg: No edema.     Left lower leg: No edema.  Lymphadenopathy:     Cervical: No cervical adenopathy.  Skin:    General: Skin is warm.     Coloration: Skin is not jaundiced or pale.     Findings: No bruising, erythema, lesion or rash.  Neurological:     General: No focal deficit present.     Mental Status: She is alert and oriented to person, place, and time. Mental status is at baseline.     Cranial Nerves: No cranial nerve deficit.     Sensory: No sensory deficit.     Motor: No weakness.     Coordination: Coordination normal.     Gait: Gait normal.     Deep Tendon Reflexes: Reflexes normal.  Psychiatric:        Mood and Affect: Mood normal.        Behavior: Behavior normal.        Thought Content: Thought content normal.        Judgment: Judgment normal.           Assessment & Plan:  Screening cholesterol level - Plan: CBC with Differential/Platelet, COMPLETE METABOLIC PANEL WITH GFR, Lipid panel  Morbid obesity due to excess calories I recommended checking a CBC a CMP and a lipid panel due to the the fact that risperdal is  associated with dyslipidemia and I would like to monitor renal function and CBC.  We will continue  daily birth control to help with menorrhagia.  The remainder of her preventative care is up-to-date

## 2022-06-27 LAB — CBC WITH DIFFERENTIAL/PLATELET
Absolute Monocytes: 548 cells/uL (ref 200–950)
Basophils Absolute: 81 cells/uL (ref 0–200)
Basophils Relative: 1.1 %
Eosinophils Absolute: 207 cells/uL (ref 15–500)
Eosinophils Relative: 2.8 %
HCT: 42.9 % (ref 35.0–45.0)
Hemoglobin: 13.6 g/dL (ref 11.7–15.5)
Lymphs Abs: 2161 cells/uL (ref 850–3900)
MCH: 26.8 pg — ABNORMAL LOW (ref 27.0–33.0)
MCHC: 31.7 g/dL — ABNORMAL LOW (ref 32.0–36.0)
MCV: 84.4 fL (ref 80.0–100.0)
MPV: 10.4 fL (ref 7.5–12.5)
Monocytes Relative: 7.4 %
Neutro Abs: 4403 cells/uL (ref 1500–7800)
Neutrophils Relative %: 59.5 %
Platelets: 373 10*3/uL (ref 140–400)
RBC: 5.08 10*6/uL (ref 3.80–5.10)
RDW: 12.2 % (ref 11.0–15.0)
Total Lymphocyte: 29.2 %
WBC: 7.4 10*3/uL (ref 3.8–10.8)

## 2022-06-27 LAB — COMPLETE METABOLIC PANEL WITH GFR
AG Ratio: 1.1 (calc) (ref 1.0–2.5)
ALT: 10 U/L (ref 6–29)
AST: 17 U/L (ref 10–30)
Albumin: 4.1 g/dL (ref 3.6–5.1)
Alkaline phosphatase (APISO): 87 U/L (ref 31–125)
BUN: 13 mg/dL (ref 7–25)
CO2: 27 mmol/L (ref 20–32)
Calcium: 9.4 mg/dL (ref 8.6–10.2)
Chloride: 104 mmol/L (ref 98–110)
Creat: 0.78 mg/dL (ref 0.50–0.96)
Globulin: 3.6 g/dL (calc) (ref 1.9–3.7)
Glucose, Bld: 77 mg/dL (ref 65–99)
Potassium: 4.4 mmol/L (ref 3.5–5.3)
Sodium: 140 mmol/L (ref 135–146)
Total Bilirubin: 0.3 mg/dL (ref 0.2–1.2)
Total Protein: 7.7 g/dL (ref 6.1–8.1)
eGFR: 106 mL/min/{1.73_m2} (ref >=60)

## 2022-06-27 LAB — LIPID PANEL
Cholesterol: 148 mg/dL (ref <200)
HDL: 47 mg/dL — ABNORMAL LOW (ref >=50)
LDL Cholesterol (Calc): 86 mg/dL (calc)
Non-HDL Cholesterol (Calc): 101 mg/dL (calc) (ref <130)
Total CHOL/HDL Ratio: 3.1 (calc) (ref <5.0)
Triglycerides: 64 mg/dL (ref <150)

## 2022-08-07 ENCOUNTER — Other Ambulatory Visit: Payer: Self-pay | Admitting: Family Medicine

## 2022-08-11 DIAGNOSIS — F419 Anxiety disorder, unspecified: Secondary | ICD-10-CM | POA: Diagnosis not present

## 2022-08-11 DIAGNOSIS — F331 Major depressive disorder, recurrent, moderate: Secondary | ICD-10-CM | POA: Diagnosis not present

## 2022-08-11 DIAGNOSIS — F632 Kleptomania: Secondary | ICD-10-CM | POA: Diagnosis not present

## 2022-08-11 DIAGNOSIS — F919 Conduct disorder, unspecified: Secondary | ICD-10-CM | POA: Diagnosis not present

## 2022-08-11 DIAGNOSIS — F79 Unspecified intellectual disabilities: Secondary | ICD-10-CM | POA: Diagnosis not present

## 2022-08-28 ENCOUNTER — Other Ambulatory Visit: Payer: Self-pay | Admitting: Family Medicine

## 2022-08-28 MED ORDER — NORETHINDRONE ACET-ETHINYL EST 1.5-30 MG-MCG PO TABS: 1.0000 | ORAL_TABLET | Freq: Every day | ORAL | 11 refills | Status: DC

## 2022-11-12 ENCOUNTER — Encounter: Payer: Self-pay | Admitting: Podiatry

## 2022-11-12 ENCOUNTER — Ambulatory Visit (INDEPENDENT_AMBULATORY_CARE_PROVIDER_SITE_OTHER): Payer: Medicaid Other

## 2022-11-12 ENCOUNTER — Ambulatory Visit (INDEPENDENT_AMBULATORY_CARE_PROVIDER_SITE_OTHER): Payer: Medicaid Other | Admitting: Podiatry

## 2022-11-12 DIAGNOSIS — M722 Plantar fascial fibromatosis: Secondary | ICD-10-CM

## 2022-11-12 MED ORDER — TRIAMCINOLONE ACETONIDE 10 MG/ML IJ SUSP
10.0000 mg | Freq: Once | INTRAMUSCULAR | Status: AC
Start: 2022-11-12 — End: 2022-11-12
  Administered 2022-11-12: 10 mg via INTRA_ARTICULAR

## 2022-11-13 NOTE — Progress Notes (Signed)
Subjective:   Patient ID: Leslie Ballard, female   DOB: 27 y.o.   MRN: [id number]   HPI Patient states she has developed some pain in her forefoot and her heel has been very good for the last couple years since surgery   ROS      Objective:  Physical Exam  Neurovascular status intact with patient found to have forefoot inflammation around the lesser MPJs right localized to the second metatarsal and third metatarsal joint     Assessment:  Appears to be inflammatory capsulitis     Plan:  H&P reviewed and went ahead today and did sterile prep and then periarticular injection around the second and third MPJ 3 mg dexamethasone Kenalog 5 mg Xylocaine advised on support shoes and reappoint to recheck  X-rays indicate no signs of bony pathology appears to be superficial and appears to be soft tissue

## 2022-11-18 DIAGNOSIS — F419 Anxiety disorder, unspecified: Secondary | ICD-10-CM | POA: Diagnosis not present

## 2022-11-18 DIAGNOSIS — F919 Conduct disorder, unspecified: Secondary | ICD-10-CM | POA: Diagnosis not present

## 2022-11-18 DIAGNOSIS — F79 Unspecified intellectual disabilities: Secondary | ICD-10-CM | POA: Diagnosis not present

## 2022-11-18 DIAGNOSIS — F331 Major depressive disorder, recurrent, moderate: Secondary | ICD-10-CM | POA: Diagnosis not present

## 2023-01-13 DIAGNOSIS — H5213 Myopia, bilateral: Secondary | ICD-10-CM | POA: Diagnosis not present

## 2023-01-25 ENCOUNTER — Ambulatory Visit: Payer: Self-pay | Admitting: Family Medicine

## 2023-01-25 NOTE — Telephone Encounter (Signed)
Copied from CRM [id number]. Topic: Clinical - Red Word Triage >> Jan 25, 2023 12:56 PM Tiffany H wrote: Red Word that prompted transfer to Nurse Triage: Chest pain/shortness of breath. Started yesterday.   Chief Complaint: Chest pain Symptoms: worsening pain when taking a deep breath Frequency: started yesterday Pertinent Negatives: Patient denies history of heart diseason Disposition: [x] ED /[] Urgent Care (no appt availability in office) / [] Appointment(In office/virtual)/ []  [place] [facility]/ [] Home Care/ [] Refused Recommended Disposition /[] [place] [facility]/ []  Follow-up with PCP Additional Notes: Pt reports 8/10 pain under L breast. States that the pain is worse when taking deep breath. Patient reports pain is constant and was mildly relieved with an antacid. Pt sts that she will go to the ED as recommended as there are no available PCP appts until December. Patient stated that she will go to [facility] ED at [facility].    Reason for Disposition  Taking a deep breath makes pain worse  Answer Assessment - Initial Assessment Questions 1. LOCATION: "Where does it hurt?"       Pain under left breast  2. RADIATION: "Does the pain go anywhere else?" (e.g., into neck, jaw, arms, back)     Does not radiate  3. ONSET: "When did the chest pain begin?" (Minutes, hours or days)      Started yesterday after waking up. Patient thought it was gas   4. PATTERN: "Does the pain come and go, or has it been constant since it started?"  "Does it get worse with exertion?"      Pain has been constant, eased up with antacids. Gets worse with exertion  5. DURATION: "How long does it last" (e.g., seconds, minutes, hours)     Has been consistent for  the past 24 hours  6. SEVERITY: "How bad is the pain?"  (e.g., Scale 1-10; mild, moderate, or severe)    - MILD (1-3): doesn't interfere with normal activities     - MODERATE (4-7): interferes with normal activities or awakens from sleep     - SEVERE (8-10): excruciating pain, unable to do any normal activities       Patients endorses 8 or 9 pain rating, but sts that she is able to still move around  7. CARDIAC RISK FACTORS: "Do you have any history of heart problems or risk factors for heart disease?" (e.g., angina, prior heart attack; diabetes, high blood pressure, high cholesterol, smoker, or strong family history of heart disease)     Borderline High Blood pressure  8. PULMONARY RISK FACTORS: "Do you have any history of lung disease?"  (e.g., blood clots in lung, asthma, emphysema, birth control pills)     Takes birth controls  68. CAUSE: "What do you think is causing the chest pain?"     Thought it was gas, but pain not fully relieved with antacids  10. OTHER SYMPTOMS: "Do you have any other symptoms?" (e.g., dizziness, nausea, vomiting, sweating, fever, difficulty breathing, cough)       Trouble taking deep breath  11. PREGNANCY: "Is there any chance you are pregnant?" "When was your last menstrual period?"       No; last menstrual has been "a while"  Protocols used: Chest Pain-A-AH

## 2023-03-05 DIAGNOSIS — H5213 Myopia, bilateral: Secondary | ICD-10-CM | POA: Diagnosis not present

## 2023-04-15 DIAGNOSIS — F331 Major depressive disorder, recurrent, moderate: Secondary | ICD-10-CM | POA: Diagnosis not present

## 2023-04-15 DIAGNOSIS — F919 Conduct disorder, unspecified: Secondary | ICD-10-CM | POA: Diagnosis not present

## 2023-04-15 DIAGNOSIS — F79 Unspecified intellectual disabilities: Secondary | ICD-10-CM | POA: Diagnosis not present

## 2023-04-15 DIAGNOSIS — F419 Anxiety disorder, unspecified: Secondary | ICD-10-CM | POA: Diagnosis not present

## 2023-06-22 ENCOUNTER — Telehealth: Payer: Self-pay

## 2023-06-22 NOTE — Telephone Encounter (Signed)
 Copied from CRM [id number]. Topic: General - Other >> Jun 22, 2023  4:44 PM Santiya F wrote: Reason for CRM: patient has a lab appointment on 06/23/23 , she needs labs done regarding her fluoxetine.

## 2023-06-23 ENCOUNTER — Other Ambulatory Visit

## 2023-06-23 DIAGNOSIS — Z13220 Encounter for screening for lipoid disorders: Secondary | ICD-10-CM

## 2023-06-24 LAB — CBC WITH DIFFERENTIAL/PLATELET
Absolute Lymphocytes: 1817 {cells}/uL (ref 850–3900)
Absolute Monocytes: 569 {cells}/uL (ref 200–950)
Basophils Absolute: 79 {cells}/uL (ref 0–200)
Basophils Relative: 1 %
Eosinophils Absolute: 221 {cells}/uL (ref 15–500)
Eosinophils Relative: 2.8 %
HCT: 41.8 % (ref 35.0–45.0)
Hemoglobin: 13.2 g/dL (ref 11.7–15.5)
MCH: 26.9 pg — ABNORMAL LOW (ref 27.0–33.0)
MCHC: 31.6 g/dL — ABNORMAL LOW (ref 32.0–36.0)
MCV: 85.1 fL (ref 80.0–100.0)
MPV: 9.9 fL (ref 7.5–12.5)
Monocytes Relative: 7.2 %
Neutro Abs: 5214 {cells}/uL (ref 1500–7800)
Neutrophils Relative %: 66 %
Platelets: 363 10*3/uL (ref 140–400)
RBC: 4.91 10*6/uL (ref 3.80–5.10)
RDW: 12 % (ref 11.0–15.0)
Total Lymphocyte: 23 %
WBC: 7.9 10*3/uL (ref 3.8–10.8)

## 2023-06-24 LAB — COMPLETE METABOLIC PANEL WITHOUT GFR
AG Ratio: 1.1 (calc) (ref 1.0–2.5)
ALT: 13 U/L (ref 6–29)
AST: 12 U/L (ref 10–30)
Albumin: 3.8 g/dL (ref 3.6–5.1)
Alkaline phosphatase (APISO): 77 U/L (ref 31–125)
BUN: 13 mg/dL (ref 7–25)
CO2: 26 mmol/L (ref 20–32)
Calcium: 8.7 mg/dL (ref 8.6–10.2)
Chloride: 105 mmol/L (ref 98–110)
Creat: 0.8 mg/dL (ref 0.50–0.96)
Globulin: 3.5 g/dL (ref 1.9–3.7)
Glucose, Bld: 88 mg/dL (ref 65–99)
Potassium: 4 mmol/L (ref 3.5–5.3)
Sodium: 138 mmol/L (ref 135–146)
Total Bilirubin: 0.4 mg/dL (ref 0.2–1.2)
Total Protein: 7.3 g/dL (ref 6.1–8.1)

## 2023-06-24 LAB — LIPID PANEL
Cholesterol: 135 mg/dL (ref <200)
HDL: 41 mg/dL — ABNORMAL LOW (ref >=50)
LDL Cholesterol (Calc): 80 mg/dL
Non-HDL Cholesterol (Calc): 94 mg/dL (ref <130)
Total CHOL/HDL Ratio: 3.3 (calc) (ref <5.0)
Triglycerides: 64 mg/dL (ref <150)

## 2023-09-02 ENCOUNTER — Other Ambulatory Visit: Payer: Self-pay | Admitting: Family Medicine

## 2023-10-07 ENCOUNTER — Ambulatory Visit: Admitting: Family Medicine

## 2023-10-07 ENCOUNTER — Encounter: Payer: Self-pay | Admitting: Family Medicine

## 2023-10-07 VITALS — BP 136/86 | HR 102 | Ht 66.0 in | Wt 283.2 lb

## 2023-10-07 DIAGNOSIS — Z13220 Encounter for screening for lipoid disorders: Secondary | ICD-10-CM

## 2023-10-07 DIAGNOSIS — Z0001 Encounter for general adult medical examination with abnormal findings: Secondary | ICD-10-CM | POA: Diagnosis not present

## 2023-10-07 DIAGNOSIS — Z9229 Personal history of other drug therapy: Secondary | ICD-10-CM

## 2023-10-07 DIAGNOSIS — Z6841 Body Mass Index (BMI) 40.0 and over, adult: Secondary | ICD-10-CM

## 2023-10-07 DIAGNOSIS — E781 Pure hyperglyceridemia: Secondary | ICD-10-CM | POA: Diagnosis not present

## 2023-10-07 DIAGNOSIS — Z0000 Encounter for general adult medical examination without abnormal findings: Secondary | ICD-10-CM

## 2023-10-07 NOTE — Progress Notes (Signed)
 Subjective:    Patient ID: Leslie Ballard, female    DOB: 01-May-1995, 28 y.o.   MRN: [id number]  HPI  Patient is a very pleasant 28 year old African-American female here today for complete physical exam.  She denies any concerns.  She is still taking her birth control pills.   She denies any chest pain shortness of breath or dyspnea on exertion.  She denies any abdominal pain or menorrhagia or menorrhagia.  She sees a psychiatrist who is prescribing her Tegretol , Risperdal, and Prozac.  In April, we will recheck the patient's CBC CMP fasting lipid panel.  These were all well within months.  Therefore I do not feel that we have to check this again today.  However the patient request that we do.  She states that her doctors at her psychiatrist office want to monitor her lab work closely because of her medication. Wt Readings from Last 3 Encounters:  10/07/23 283 lb 3.2 oz (128.5 kg)  06/26/22 271 lb 9.6 oz (123.2 kg)  03/21/21 270 lb (122.5 kg)   Since her last visit, the patient has gained 12 pounds.  She states that she is not engaging in any regular aerobic exercise.  She also likes to drink sodas.  She likes to eat a high carbohydrate diet.  We discussed this today.  I recommended discontinuation of soda or at least drinking diet soda.  I recommended drinking more water.  I recommended reducing her carbs such as bread and pizza and junk food like hamburgers and hotdogs.  I recommended try to eat more fruits and vegetables.  Patient's last Pap smear was in 2023.  It is due next year. Past Medical History:  Diagnosis Date   Bipolar 1 disorder (HCC)    Dr. [facility])    Current Outpatient Medications on File Prior to Visit  Medication Sig Dispense Refill   FLUoxetine (PROZAC) 20 MG capsule Take 20 mg by mouth every morning.     meloxicam  (MOBIC ) 15 MG tablet Take 1 tablet (15 mg total) by mouth daily. 30 tablet 0   Norethindrone  Acetate-Ethinyl Estradiol (LOESTRIN) 1.5-30 MG-MCG tablet  Take 1 tablet by mouth daily. 21 tablet 11   risperiDONE (RISPERDAL) 1 MG tablet Take 1 mg by mouth 2 (two) times daily.     TEGRETOL -XR 400 MG 12 hr tablet Take 400 mg by mouth daily.     No current facility-administered medications on file prior to visit.    No past surgical history on file. No Known Allergies Social History   Socioeconomic History   Marital status: Single    Spouse name: Not on file   Number of children: Not on file   Years of education: Not on file   Highest education level: Not on file  Occupational History   Not on file  Tobacco Use   Smoking status: Never   Smokeless tobacco: Never  Vaping Use   Vaping status: Never Used  Substance and Sexual Activity   Alcohol use: No   Drug use: No   Sexual activity: Yes    Birth control/protection: Injection  Other Topics Concern   Not on file  Social History Narrative   Not on file   Social Drivers of Health   Financial Resource Strain: Not on file  Food Insecurity: Not on file  Transportation Needs: No Transportation Needs (06/22/2022)   PRAPARE - Transportation    Lack of Transportation (Medical): No    Lack of Transportation (Non-Medical): No  Physical Activity:  Not on file  Stress: Not on file  Social Connections: Not on file  Intimate Partner Violence: Not on file   Family History  Problem Relation Age of Onset   Diabetes Mother    Hypertension Mother    Hypertension Father    Diabetes Father    Diabetes Paternal Uncle    Gout Paternal Uncle      Review of Systems     Objective:   Physical Exam Vitals reviewed.  Constitutional:      General: She is not in acute distress.    Appearance: Normal appearance. She is obese. She is not ill-appearing, toxic-appearing or diaphoretic.  HENT:     Head: Normocephalic and atraumatic.     Right Ear: Tympanic membrane, ear canal and external ear normal. There is no impacted cerumen.     Left Ear: Tympanic membrane, ear canal and external ear  normal. There is no impacted cerumen.     Nose: Nose normal. No congestion or rhinorrhea.     Mouth/Throat:     Mouth: Mucous membranes are moist.     Pharynx: Oropharynx is clear. No oropharyngeal exudate or posterior oropharyngeal erythema.  Eyes:     General: No scleral icterus.       Right eye: No discharge.        Left eye: No discharge.     Extraocular Movements: Extraocular movements intact.     Conjunctiva/sclera: Conjunctivae normal.     Pupils: Pupils are equal, round, and reactive to light.  Neck:     Vascular: No carotid bruit.  Cardiovascular:     Rate and Rhythm: Normal rate and regular rhythm.     Pulses: Normal pulses.     Heart sounds: Normal heart sounds. No murmur heard.    No friction rub. No gallop.  Pulmonary:     Effort: Pulmonary effort is normal. No respiratory distress.     Breath sounds: Normal breath sounds. No stridor. No wheezing, rhonchi or rales.  Chest:     Chest wall: No tenderness.  Abdominal:     General: Abdomen is flat. Bowel sounds are normal. There is no distension.     Palpations: Abdomen is soft. There is no mass.     Tenderness: There is no abdominal tenderness. There is no right CVA tenderness, left CVA tenderness, guarding or rebound.     Hernia: No hernia is present.  Musculoskeletal:        General: Normal range of motion.     Cervical back: Normal range of motion and neck supple. No rigidity. No muscular tenderness.     Right lower leg: No edema.     Left lower leg: No edema.  Lymphadenopathy:     Cervical: No cervical adenopathy.  Skin:    General: Skin is warm.     Coloration: Skin is not jaundiced or pale.     Findings: No bruising, erythema, lesion or rash.  Neurological:     General: No focal deficit present.     Mental Status: She is alert and oriented to person, place, and time. Mental status is at baseline.     Cranial Nerves: No cranial nerve deficit.     Sensory: No sensory deficit.     Motor: No weakness.      Coordination: Coordination normal.     Gait: Gait normal.     Deep Tendon Reflexes: Reflexes normal.  Psychiatric:        Mood and Affect: Mood normal.  Behavior: Behavior normal.        Thought Content: Thought content normal.        Judgment: Judgment normal.           Assessment & Plan:  Hx of high risk medication treatment - Plan: CBC with Differential/Platelet, Comprehensive metabolic panel with GFR, Lipid panel  Screening cholesterol level  Morbid obesity due to excess calories (HCC)  General medical exam Patient's BMI is elevated at 45.  I recommended a low-carb diet.  I recommended drinking more water.  I recommended avoiding sodas.  I recommended eating more fruits and vegetables.  I recommended decreasing her consumption of starches such as bread and fries and potatoes and pasta.  I will check CBC, CMP, fasting lipid panel as requested by the patient for her psychiatrist.  I encouraged her to try to get 1 hour a day of aerobic exercise such as walking or riding a bicycle.  Also encouraged weight loss.

## 2023-10-08 ENCOUNTER — Other Ambulatory Visit: Payer: Self-pay | Admitting: Family Medicine

## 2023-10-08 ENCOUNTER — Ambulatory Visit: Payer: Self-pay | Admitting: Family Medicine

## 2023-10-08 LAB — COMPREHENSIVE METABOLIC PANEL WITH GFR
AG Ratio: 1.2 (calc) (ref 1.0–2.5)
ALT: 12 U/L (ref 6–29)
AST: 16 U/L (ref 10–30)
Albumin: 4.1 g/dL (ref 3.6–5.1)
Alkaline phosphatase (APISO): 91 U/L (ref 31–125)
BUN: 12 mg/dL (ref 7–25)
CO2: 26 mmol/L (ref 20–32)
Calcium: 9.1 mg/dL (ref 8.6–10.2)
Chloride: 107 mmol/L (ref 98–110)
Creat: 0.76 mg/dL (ref 0.50–0.96)
Globulin: 3.4 g/dL (ref 1.9–3.7)
Glucose, Bld: 87 mg/dL (ref 65–99)
Potassium: 4.2 mmol/L (ref 3.5–5.3)
Sodium: 140 mmol/L (ref 135–146)
Total Bilirubin: 0.4 mg/dL (ref 0.2–1.2)
Total Protein: 7.5 g/dL (ref 6.1–8.1)
eGFR: 109 mL/min/1.73m2 (ref >=60)

## 2023-10-08 LAB — CBC WITH DIFFERENTIAL/PLATELET
Absolute Lymphocytes: 1583 {cells}/uL (ref 850–3900)
Absolute Monocytes: 450 {cells}/uL (ref 200–950)
Basophils Absolute: 53 {cells}/uL (ref 0–200)
Basophils Relative: 0.7 %
Eosinophils Absolute: 120 {cells}/uL (ref 15–500)
Eosinophils Relative: 1.6 %
HCT: 43.7 % (ref 35.0–45.0)
Hemoglobin: 13.4 g/dL (ref 11.7–15.5)
MCH: 26.5 pg — ABNORMAL LOW (ref 27.0–33.0)
MCHC: 30.7 g/dL — ABNORMAL LOW (ref 32.0–36.0)
MCV: 86.5 fL (ref 80.0–100.0)
MPV: 10 fL (ref 7.5–12.5)
Monocytes Relative: 6 %
Neutro Abs: 5295 {cells}/uL (ref 1500–7800)
Neutrophils Relative %: 70.6 %
Platelets: 379 Thousand/uL (ref 140–400)
RBC: 5.05 Million/uL (ref 3.80–5.10)
RDW: 12.5 % (ref 11.0–15.0)
Total Lymphocyte: 21.1 %
WBC: 7.5 Thousand/uL (ref 3.8–10.8)

## 2023-10-08 LAB — LIPID PANEL
Cholesterol: 145 mg/dL (ref <200)
HDL: 41 mg/dL — ABNORMAL LOW (ref >=50)
LDL Cholesterol (Calc): 90 mg/dL
Non-HDL Cholesterol (Calc): 104 mg/dL (ref <130)
Total CHOL/HDL Ratio: 3.5 (calc) (ref <5.0)
Triglycerides: 62 mg/dL (ref <150)

## 2023-10-08 NOTE — Telephone Encounter (Signed)
 Prescription Request  10/08/2023  LOV: 10/07/2023  What is the name of the medication or equipment?   Leslie Ballard (Norethindrone  Acetate-Ethinyl Estradiol (LOESTRIN) 1.5-30 MG-MCG tablet)   Have you contacted your pharmacy to request a refill? Yes   Which pharmacy would you like this sent to?  [facility] - [place], [place] - [address] [name] [address] Phone: [phone] Fax: [phone]    Patient notified that their request is being sent to the clinical staff for review and that they should receive a response within 2 business days.   Please advise pharmacist.

## 2023-10-11 ENCOUNTER — Other Ambulatory Visit: Payer: Self-pay | Admitting: Family Medicine

## 2023-10-11 MED ORDER — NORETHINDRONE ACET-ETHINYL EST 1.5-30 MG-MCG PO TABS
1.0000 | ORAL_TABLET | Freq: Every day | ORAL | 11 refills | Status: AC
Start: 2023-10-11 — End: ?

## 2023-10-11 NOTE — Telephone Encounter (Signed)
 Requested Prescriptions  Pending Prescriptions Disp Refills   Norethindrone  Acetate-Ethinyl Estradiol (LOESTRIN) 1.5-30 MG-MCG tablet 21 tablet 11    Sig: Take 1 tablet by mouth daily.     OB/GYN:  Contraceptives Passed - 10/11/2023 11:14 AM      Passed - Last BP in normal range    BP Readings from Last 1 Encounters:  10/07/23 136/86         Passed - Valid encounter within last 12 months    Recent Outpatient Visits           4 days ago Hx of high risk medication treatment   [facility] Duanne Butler DASEN, MD   1 year ago Screening cholesterol level   [place] [facility] Pickard, Butler DASEN, MD              Passed - Patient is not a smoker

## 2023-11-04 DIAGNOSIS — F79 Unspecified intellectual disabilities: Secondary | ICD-10-CM | POA: Diagnosis not present

## 2023-11-04 DIAGNOSIS — F331 Major depressive disorder, recurrent, moderate: Secondary | ICD-10-CM | POA: Diagnosis not present

## 2023-11-04 DIAGNOSIS — F419 Anxiety disorder, unspecified: Secondary | ICD-10-CM | POA: Diagnosis not present

## 2023-11-04 DIAGNOSIS — F919 Conduct disorder, unspecified: Secondary | ICD-10-CM | POA: Diagnosis not present

## 2023-11-04 DIAGNOSIS — F632 Kleptomania: Secondary | ICD-10-CM | POA: Diagnosis not present

## 2023-11-29 ENCOUNTER — Ambulatory Visit (INDEPENDENT_AMBULATORY_CARE_PROVIDER_SITE_OTHER)

## 2023-11-29 DIAGNOSIS — Z23 Encounter for immunization: Secondary | ICD-10-CM

## 2023-11-29 DIAGNOSIS — Z0000 Encounter for general adult medical examination without abnormal findings: Secondary | ICD-10-CM
